# Patient Record
Sex: Male | Born: 1975
Health system: Southern US, Community
[De-identification: ages and names within clinical notes are randomized; demographics above are authoritative.]

## PROBLEM LIST (undated history)

## (undated) DIAGNOSIS — I472 Ventricular tachycardia: Secondary | ICD-10-CM

## (undated) DIAGNOSIS — E785 Hyperlipidemia, unspecified: Secondary | ICD-10-CM

## (undated) DIAGNOSIS — I509 Heart failure, unspecified: Secondary | ICD-10-CM

## (undated) HISTORY — DX: Hyperlipidemia, unspecified: E78.5

## (undated) HISTORY — DX: Ventricular tachycardia: I47.2

---

## 2004-06-24 ENCOUNTER — Emergency Department (HOSPITAL_COMMUNITY): Admission: EM | Admit: 2004-06-24 | Discharge: 2004-06-24 | Payer: Self-pay | Admitting: Emergency Medicine

## 2008-05-31 ENCOUNTER — Emergency Department (HOSPITAL_COMMUNITY): Admission: EM | Admit: 2008-05-31 | Discharge: 2008-05-31 | Payer: Self-pay | Admitting: Family Medicine

## 2008-08-04 ENCOUNTER — Emergency Department (HOSPITAL_COMMUNITY): Admission: EM | Admit: 2008-08-04 | Discharge: 2008-08-04 | Payer: Self-pay | Admitting: Family Medicine

## 2009-06-15 ENCOUNTER — Emergency Department (HOSPITAL_COMMUNITY): Admission: EM | Admit: 2009-06-15 | Discharge: 2009-06-15 | Payer: Self-pay | Admitting: Emergency Medicine

## 2012-06-12 ENCOUNTER — Other Ambulatory Visit (HOSPITAL_COMMUNITY): Payer: Self-pay | Admitting: Family Medicine

## 2012-06-12 DIAGNOSIS — R0602 Shortness of breath: Secondary | ICD-10-CM

## 2012-06-14 DIAGNOSIS — I509 Heart failure, unspecified: Secondary | ICD-10-CM

## 2012-06-14 HISTORY — DX: Heart failure, unspecified: I50.9

## 2012-06-19 ENCOUNTER — Encounter (HOSPITAL_COMMUNITY): Payer: Self-pay

## 2012-07-06 ENCOUNTER — Encounter (HOSPITAL_COMMUNITY): Payer: Self-pay | Admitting: Emergency Medicine

## 2012-07-06 ENCOUNTER — Emergency Department (HOSPITAL_COMMUNITY): Payer: PRIVATE HEALTH INSURANCE

## 2012-07-06 ENCOUNTER — Inpatient Hospital Stay (HOSPITAL_COMMUNITY)
Admission: EM | Admit: 2012-07-06 | Discharge: 2012-07-10 | DRG: 287 | Disposition: A | Discharge status: Home / Self Care | Payer: PRIVATE HEALTH INSURANCE | Attending: Internal Medicine | Admitting: Internal Medicine

## 2012-07-06 DIAGNOSIS — N289 Disorder of kidney and ureter, unspecified: Secondary | ICD-10-CM | POA: Diagnosis present

## 2012-07-06 DIAGNOSIS — I428 Other cardiomyopathies: Secondary | ICD-10-CM | POA: Diagnosis present

## 2012-07-06 DIAGNOSIS — Z23 Encounter for immunization: Secondary | ICD-10-CM

## 2012-07-06 DIAGNOSIS — I4729 Other ventricular tachycardia: Secondary | ICD-10-CM | POA: Diagnosis not present

## 2012-07-06 DIAGNOSIS — R778 Other specified abnormalities of plasma proteins: Secondary | ICD-10-CM | POA: Diagnosis present

## 2012-07-06 DIAGNOSIS — I5023 Acute on chronic systolic (congestive) heart failure: Secondary | ICD-10-CM

## 2012-07-06 DIAGNOSIS — I5041 Acute combined systolic (congestive) and diastolic (congestive) heart failure: Secondary | ICD-10-CM

## 2012-07-06 DIAGNOSIS — Z79899 Other long term (current) drug therapy: Secondary | ICD-10-CM

## 2012-07-06 DIAGNOSIS — I472 Ventricular tachycardia, unspecified: Secondary | ICD-10-CM | POA: Diagnosis not present

## 2012-07-06 DIAGNOSIS — R0602 Shortness of breath: Secondary | ICD-10-CM

## 2012-07-06 DIAGNOSIS — R0601 Orthopnea: Secondary | ICD-10-CM | POA: Diagnosis present

## 2012-07-06 DIAGNOSIS — I10 Essential (primary) hypertension: Secondary | ICD-10-CM | POA: Diagnosis present

## 2012-07-06 DIAGNOSIS — Z8249 Family history of ischemic heart disease and other diseases of the circulatory system: Secondary | ICD-10-CM

## 2012-07-06 DIAGNOSIS — I5021 Acute systolic (congestive) heart failure: Principal | ICD-10-CM | POA: Diagnosis present

## 2012-07-06 DIAGNOSIS — I509 Heart failure, unspecified: Secondary | ICD-10-CM | POA: Diagnosis present

## 2012-07-06 DIAGNOSIS — N179 Acute kidney failure, unspecified: Secondary | ICD-10-CM | POA: Diagnosis present

## 2012-07-06 DIAGNOSIS — R9431 Abnormal electrocardiogram [ECG] [EKG]: Secondary | ICD-10-CM | POA: Diagnosis present

## 2012-07-06 LAB — CBC
HCT: 41.8 % (ref 39.0–52.0)
Hemoglobin: 14.5 g/dL (ref 13.0–17.0)
MCH: 29.2 pg (ref 26.0–34.0)
MCHC: 34.7 g/dL (ref 30.0–36.0)
MCV: 84.3 fL (ref 78.0–100.0)
Platelets: 155 10*3/uL (ref 150–400)
RBC: 4.96 MIL/uL (ref 4.22–5.81)
RDW: 13.3 % (ref 11.5–15.5)
WBC: 6.9 10*3/uL (ref 4.0–10.5)

## 2012-07-06 LAB — CREATININE, SERUM
Creatinine, Ser: 1.45 mg/dL — ABNORMAL HIGH (ref 0.50–1.35)
GFR calc Af Amer: 70 mL/min — ABNORMAL LOW (ref >90)
GFR calc non Af Amer: 61 mL/min — ABNORMAL LOW (ref >90)

## 2012-07-06 LAB — BASIC METABOLIC PANEL
CO2: 27 mEq/L (ref 19–32)
Calcium: 9.4 mg/dL (ref 8.4–10.5)
Chloride: 105 mEq/L (ref 96–112)
Creatinine, Ser: 1.51 mg/dL — ABNORMAL HIGH (ref 0.50–1.35)
Potassium: 4.9 mEq/L (ref 3.5–5.1)
Sodium: 142 mEq/L (ref 135–145)

## 2012-07-06 LAB — URINALYSIS, ROUTINE W REFLEX MICROSCOPIC
Bilirubin Urine: NEGATIVE
Glucose, UA: NEGATIVE mg/dL
Ketones, ur: NEGATIVE mg/dL
Leukocytes, UA: NEGATIVE
Nitrite: NEGATIVE
Specific Gravity, Urine: 1.01 (ref 1.005–1.030)
pH: 6.5 (ref 5.0–8.0)

## 2012-07-06 LAB — CBC WITH DIFFERENTIAL/PLATELET
Basophils Absolute: 0 10*3/uL (ref 0.0–0.1)
HCT: 42.2 % (ref 39.0–52.0)
Hemoglobin: 14.5 g/dL (ref 13.0–17.0)
Lymphocytes Relative: 24 % (ref 12–46)
Lymphs Abs: 1.4 10*3/uL (ref 0.7–4.0)
MCH: 29.2 pg (ref 26.0–34.0)
Neutrophils Relative %: 64 % (ref 43–77)
Platelets: 162 10*3/uL (ref 150–400)
RDW: 13.4 % (ref 11.5–15.5)

## 2012-07-06 LAB — HEPATIC FUNCTION PANEL
AST: 36 U/L (ref 0–37)
Bilirubin, Direct: 0.2 mg/dL (ref 0.0–0.3)
Indirect Bilirubin: 0.9 mg/dL (ref 0.3–0.9)

## 2012-07-06 LAB — RAPID URINE DRUG SCREEN, HOSP PERFORMED
Amphetamines: NOT DETECTED
Benzodiazepines: NOT DETECTED
Cocaine: NOT DETECTED

## 2012-07-06 LAB — TROPONIN I
Troponin I: 0.39 ng/mL (ref <0.30)
Troponin I: 0.42 ng/mL (ref <0.30)

## 2012-07-06 LAB — CK TOTAL AND CKMB (NOT AT ARMC)
CK, MB: 4.2 ng/mL — ABNORMAL HIGH (ref 0.3–4.0)
Relative Index: 3.4 — ABNORMAL HIGH (ref 0.0–2.5)
Total CK: 124 U/L (ref 7–232)

## 2012-07-06 LAB — TSH: TSH: 1.467 u[IU]/mL (ref 0.350–4.500)

## 2012-07-06 LAB — PRO B NATRIURETIC PEPTIDE: Pro B Natriuretic peptide (BNP): 3526 pg/mL — ABNORMAL HIGH (ref 0–125)

## 2012-07-06 MED ORDER — ENOXAPARIN SODIUM 40 MG/0.4ML ~~LOC~~ SOLN
40.0000 mg | Freq: Every day | SUBCUTANEOUS | Status: DC
  Administered 2012-07-06 – 2012-07-07 (×2): 40 mg via SUBCUTANEOUS
  Filled 2012-07-06 (×3): qty 0.4

## 2012-07-06 MED ORDER — ONDANSETRON HCL 4 MG/2ML IJ SOLN
4.0000 mg | Freq: Once | INTRAMUSCULAR | Status: AC
  Administered 2012-07-06: 4 mg via INTRAVENOUS
  Filled 2012-07-06: qty 2

## 2012-07-06 MED ORDER — SODIUM CHLORIDE 0.9 % IV BOLUS (SEPSIS)
250.0000 mL | Freq: Once | INTRAVENOUS | Status: AC
  Administered 2012-07-06: 250 mL via INTRAVENOUS

## 2012-07-06 MED ORDER — PANTOPRAZOLE SODIUM 20 MG PO TBEC
20.0000 mg | DELAYED_RELEASE_TABLET | Freq: Every day | ORAL | Status: DC
Start: 2012-07-06 — End: 2012-07-10
  Administered 2012-07-06 – 2012-07-09 (×4): 20 mg via ORAL
  Filled 2012-07-06 (×5): qty 1

## 2012-07-06 MED ORDER — ONDANSETRON HCL 4 MG/2ML IJ SOLN
4.0000 mg | Freq: Four times a day (QID) | INTRAMUSCULAR | Status: DC | PRN
  Administered 2012-07-07: 4 mg via INTRAVENOUS
  Filled 2012-07-06: qty 2

## 2012-07-06 MED ORDER — PNEUMOCOCCAL VAC POLYVALENT 25 MCG/0.5ML IJ INJ
0.5000 mL | INJECTION | INTRAMUSCULAR | Status: AC
  Filled 2012-07-06: qty 0.5

## 2012-07-06 MED ORDER — IOHEXOL 350 MG/ML SOLN
80.0000 mL | Freq: Once | INTRAVENOUS | Status: AC | PRN
  Administered 2012-07-06: 80 mL via INTRAVENOUS

## 2012-07-06 MED ORDER — ACETAMINOPHEN 325 MG PO TABS
650.0000 mg | ORAL_TABLET | ORAL | Status: DC | PRN
  Administered 2012-07-06 – 2012-07-08 (×2): 650 mg via ORAL
  Filled 2012-07-06 (×3): qty 2

## 2012-07-06 MED ORDER — MONTELUKAST SODIUM 4 MG PO CHEW
4.0000 mg | CHEWABLE_TABLET | Freq: Every day | ORAL | Status: DC
  Administered 2012-07-06 – 2012-07-09 (×4): 4 mg via ORAL
  Filled 2012-07-06 (×5): qty 1

## 2012-07-06 MED ORDER — BUDESONIDE-FORMOTEROL FUMARATE 160-4.5 MCG/ACT IN AERO
2.0000 | INHALATION_SPRAY | Freq: Two times a day (BID) | RESPIRATORY_TRACT | Status: DC
  Administered 2012-07-06 – 2012-07-09 (×5): 2 via RESPIRATORY_TRACT
  Filled 2012-07-06: qty 6

## 2012-07-06 MED ORDER — ALPRAZOLAM 0.25 MG PO TABS
0.2500 mg | ORAL_TABLET | Freq: Three times a day (TID) | ORAL | Status: DC | PRN
  Administered 2012-07-09: 0.25 mg via ORAL
  Filled 2012-07-06: qty 1

## 2012-07-06 MED ORDER — ASPIRIN EC 81 MG PO TBEC
81.0000 mg | DELAYED_RELEASE_TABLET | Freq: Every day | ORAL | Status: DC
  Administered 2012-07-07 – 2012-07-10 (×3): 81 mg via ORAL
  Filled 2012-07-06 (×5): qty 1

## 2012-07-06 MED ORDER — TRAMADOL HCL 50 MG PO TABS
50.0000 mg | ORAL_TABLET | Freq: Four times a day (QID) | ORAL | Status: DC | PRN
  Administered 2012-07-06 – 2012-07-09 (×6): 50 mg via ORAL
  Filled 2012-07-06 (×7): qty 1

## 2012-07-06 MED ORDER — NITROGLYCERIN 0.4 MG SL SUBL: 0.4000 mg | SUBLINGUAL_TABLET | SUBLINGUAL | Status: DC | PRN

## 2012-07-06 MED ORDER — SIMETHICONE 80 MG PO CHEW
80.0000 mg | CHEWABLE_TABLET | Freq: Four times a day (QID) | ORAL | Status: DC | PRN
  Administered 2012-07-07: 80 mg via ORAL
  Filled 2012-07-06 (×3): qty 1

## 2012-07-06 MED ORDER — SODIUM CHLORIDE 0.9 % IV SOLN
INTRAVENOUS | Status: DC
  Administered 2012-07-06 – 2012-07-07 (×3): via INTRAVENOUS

## 2012-07-06 MED ORDER — FUROSEMIDE 10 MG/ML IJ SOLN
40.0000 mg | Freq: Once | INTRAMUSCULAR | Status: AC
  Administered 2012-07-06: 40 mg via INTRAVENOUS
  Filled 2012-07-06: qty 4

## 2012-07-06 MED ORDER — CARVEDILOL 3.125 MG PO TABS
3.1250 mg | ORAL_TABLET | Freq: Two times a day (BID) | ORAL | Status: DC
  Administered 2012-07-06 – 2012-07-10 (×8): 3.125 mg via ORAL
  Filled 2012-07-06 (×11): qty 1

## 2012-07-06 MED ORDER — ZOLPIDEM TARTRATE 5 MG PO TABS
5.0000 mg | ORAL_TABLET | Freq: Every evening | ORAL | Status: DC | PRN
  Administered 2012-07-06 – 2012-07-09 (×3): 5 mg via ORAL
  Filled 2012-07-06 (×3): qty 1

## 2012-07-06 MED ORDER — FUROSEMIDE 10 MG/ML IJ SOLN
40.0000 mg | Freq: Once | INTRAMUSCULAR | Status: AC
  Administered 2012-07-06: 40 mg via INTRAVENOUS
  Filled 2012-07-06 (×2): qty 4

## 2012-07-06 MED ORDER — ALBUTEROL SULFATE HFA 108 (90 BASE) MCG/ACT IN AERS: 2.0000 | INHALATION_SPRAY | Freq: Four times a day (QID) | RESPIRATORY_TRACT | Status: DC | PRN

## 2012-07-06 MED ORDER — ALPRAZOLAM 0.25 MG PO TABS: 0.2500 mg | ORAL_TABLET | Freq: Two times a day (BID) | ORAL | Status: DC | PRN

## 2012-07-06 NOTE — Progress Notes (Signed)
Corine Shelter notified in reference to pt having 6 beats of Vtach on the monitor and heartrate sustaining in the 120s, pt asymptomatic at this time, no new orders given, will continue to monitor

## 2012-07-06 NOTE — ED Provider Notes (Addendum)
History     CSN: [id number]  Arrival date & time 07/06/12  0713   First MD Initiated Contact with Patient 07/06/12 [phone]      Chief Complaint  Patient presents with  . Shortness of Breath    (Consider location/radiation/quality/duration/timing/severity/associated sxs/prior treatment) Patient is a 37 y.o. male presenting with shortness of breath. The history is provided by the patient.  Shortness of Breath Associated symptoms: chest pain, headaches, vomiting and wheezing   Associated symptoms: no abdominal pain, no cough, no fever and no rash   patient presents with a complaint of shortness of breath for the past 2 months. [facility] is [facility]. They have done a workup to include chest x-ray thyroid function testing. And have recently referred him to pulmonary medicine for permanent function tests. Patient has not had that done yet. Patient also had a 20 pound weight loss. Patient has had shortness of breath both exertional and with laying flat it has been getting worse last night but typically worse. It is associated with brief chest pain lasting one to 2 seconds intermittently. Occasional vomiting no fevers no bowel pain. No leg swelling. To date his studies have been negative. No history of hypertension. He reports that his blood pressure was high here today 142/112. Nurses: At room air saturation was 98%.  In addition family medicine has been treating him with albuterol inhaler. Patient feels that his throat is closing off at times particularly at night. He now has to sleep sitting up. Does not sound like there's been an evaluation to rule out pulmonary embolism.  History reviewed. No pertinent past medical history.  History reviewed. No pertinent past surgical history.  History reviewed. No pertinent family history.  History  Substance Use Topics  . Smoking status: Never Smoker   . Smokeless tobacco: Not on file  . Alcohol Use: No      Review of Systems   Constitutional: Positive for fatigue. Negative for fever.  HENT: Positive for congestion.   Eyes: Negative for visual disturbance.  Respiratory: Positive for shortness of breath and wheezing. Negative for cough and choking.   Cardiovascular: Positive for chest pain. Negative for palpitations and leg swelling.  Gastrointestinal: Positive for nausea and vomiting. Negative for abdominal pain and diarrhea.  Genitourinary: Negative for dysuria and hematuria.  Musculoskeletal: Negative for back pain.  Skin: Negative for rash.  Neurological: Positive for headaches.  Hematological: Negative for adenopathy. Does not bruise/bleed easily.  Psychiatric/Behavioral: Negative for confusion.    Allergies  Review of patient's allergies indicates no known allergies.  Home Medications   Current Outpatient Rx  Name  Route  Sig  Dispense  Refill  . albuterol (PROVENTIL HFA;VENTOLIN HFA) 108 (90 BASE) MCG/ACT inhaler   Inhalation   Inhale 2 puffs into the lungs every 6 (six) hours as needed for wheezing.         . montelukast (SINGULAIR) 4 MG chewable tablet   Oral   Chew 4 mg by mouth at bedtime.         . pantoprazole (PROTONIX) 20 MG tablet   Oral   Take 20 mg by mouth daily.         . Simethicone (GAS-X PO)   Oral   Take 1 tablet by mouth every 4 (four) hours as needed (upset stomach and bloating).           BP 128/80  Pulse 94  Temp(Src) 97.3 F (36.3 C) (Oral)  Resp 22  SpO2 97%  Physical Exam  Nursing note and vitals reviewed. Constitutional: He is oriented to person, place, and time. He appears well-developed and well-nourished. No distress.  HENT:  Head: Normocephalic and atraumatic.  Mouth/Throat: Oropharynx is clear and moist. No oropharyngeal exudate.  Eyes: Conjunctivae and EOM are normal. Pupils are equal, round, and reactive to light.  Neck: Normal range of motion. Neck supple. No JVD present. No tracheal deviation present. No thyromegaly present.   Cardiovascular: Normal rate, regular rhythm, normal heart sounds and intact distal pulses.   No murmur heard. Pulmonary/Chest: Effort normal. No stridor. No respiratory distress. He has no wheezes. He has no rales.  Abdominal: Soft. Bowel sounds are normal. There is no tenderness.  Musculoskeletal: Normal range of motion. He exhibits no edema.  Lymphadenopathy:    He has no cervical adenopathy.  Neurological: He is alert and oriented to person, place, and time. No cranial nerve deficit. He exhibits normal muscle tone. Coordination normal.  Skin: Skin is warm. No rash noted.    ED Course  Procedures (including critical care time)  Labs Reviewed  TROPONIN I - Abnormal; Notable for the following:    Troponin I 0.43 (*)    All other components within normal limits  PRO B NATRIURETIC PEPTIDE - Abnormal; Notable for the following:    Pro B Natriuretic peptide (BNP) 3526.0 (*)    All other components within normal limits  BASIC METABOLIC PANEL - Abnormal; Notable for the following:    Creatinine, Ser 1.51 (*)    GFR calc non Af Amer 58 (*)    GFR calc Af Amer 67 (*)    All other components within normal limits  HEPATIC FUNCTION PANEL - Abnormal; Notable for the following:    ALT 59 (*)    All other components within normal limits  TROPONIN I - Abnormal; Notable for the following:    Troponin I 0.39 (*)    All other components within normal limits  CBC WITH DIFFERENTIAL  LIPASE, BLOOD  URINALYSIS, ROUTINE W REFLEX MICROSCOPIC  URINE RAPID DRUG SCREEN (HOSP PERFORMED)   Dg Chest 2 View  07/06/2012  *RADIOLOGY REPORT*  Clinical Data: Shortness of breath  CHEST - 2 VIEW  Comparison: 04/22/2012  Findings: Moderate cardiomegaly.  Some increase in interstitial edema or infiltrates.  Septal lines seen peripherally at the right lung base.  No effusion.  There is some mild central peribronchial thickening.  Regional bones unremarkable.  IMPRESSION:  Cardiomegaly with interval increase in  interstitial edema.   Original Report Authenticated By: D. Andria Rhein, MD    Ct Soft Tissue Neck W Contrast  07/06/2012  *RADIOLOGY REPORT*  Clinical Data: Short of breath, hoarseness, gurgling in throat  CT NECK WITH CONTRAST  Technique:  Multidetector CT imaging of the neck was performed with intravenous contrast.  Contrast: 80mL OMNIPAQUE IOHEXOL 350 MG/ML SOLN .  Comparison: CT thorax same day  Findings: The oropharynx and hypopharynx are normal.  The epiglottis and glottis are normal.  The subglottic tissues are normal.  The parapharyngeal space is normal.  The pharyngeal mucosa is symmetric.  Submandibular glands and parotid glands are normal.  The prevertebral space is normal.  No evidence of lymphadenopathy in the neck.  There is no aggressive osseous lesions.  There is a polypoid mucosal lesion in the right maxillary sinus measuring 3.2 cm.  Limited view of the lung apices demonstrates interlobular septal thickening.  Mild mediastinal lymphadenopathy.  No vascular abnormality in the neck.  IMPRESSION:  1.  No abnormality of the hypopharynx and glottis. 2.  No evidence of lymphadenopathy in neck. 3.  Interlobular septal thickening seen in the lung apices consistent with interstitial edema. 4.  Likely  mucous retention cyst within the right maxillary sinus.   Original Report Authenticated By: Genevive Bi, M.D.    Ct Angio Chest Pe W/cm &/or Wo Cm  07/06/2012  *RADIOLOGY REPORT*  Clinical Data: Shortness of breath, hoarseness.  CT ANGIOGRAPHY CHEST  Technique:  Multidetector CT imaging of the chest using the standard protocol during bolus administration of intravenous contrast. Multiplanar reconstructed images including MIPs were obtained and reviewed to evaluate the vascular anatomy.  Contrast: 80mL OMNIPAQUE IOHEXOL 350 MG/ML SOLN  Comparison: None.  Findings: There is good contrast opacification of the pulmonary artery branches.  No discrete filling defect to suggest acute PE.There is incomplete  opacification of the thoracic aorta, which is normal in caliber.  There is four-chamber cardiac enlargement. Small bilateral pleural effusions.  Reflux of contrast from the right atrium into hepatic veins suggesting a degree of right heart dysfunction.  14 mm prevascular lymph node, 11 mm precarinal lymph node, 11 mm pretracheal lymph node.  No hilar adenopathy.  Patchy ground-glass opacities in basilar segments of both lower lobes, with septal thickening seen medially in the upper and lower lobes. No confluent airspace consolidation.  Thoracic spine and sternum intact.  IMPRESSION:  1.  Negative for acute PE. 2.  Small pleural effusions, septal thickening, contrast reflux into hepatic veins, and cardiomegaly suggesting CHF. 3.  Nonspecific mediastinal adenopathy.   Original Report Authenticated By: D. Andria Rhein, MD      Date: 07/06/2012  Rate: 87  Rhythm: normal sinus rhythm  QRS Axis: normal  Intervals: normal  ST/T Wave abnormalities: nonspecific T wave changes  Conduction Disutrbances:left anterior fascicular block  Narrative Interpretation:   Old EKG Reviewed: none available  Results for orders placed during the hospital encounter of 07/06/12  TROPONIN I      Result Value Range   Troponin I 0.43 (*) <0.30 ng/mL  CBC WITH DIFFERENTIAL      Result Value Range   WBC 5.8  4.0 - 10.5 K/uL   RBC 4.97  4.22 - 5.81 MIL/uL   Hemoglobin 14.5  13.0 - 17.0 g/dL   HCT 16.1  09.6 - 04.5 %   MCV 84.9  78.0 - 100.0 fL   MCH 29.2  26.0 - 34.0 pg   MCHC 34.4  30.0 - 36.0 g/dL   RDW 40.9  81.1 - 91.4 %   Platelets 162  150 - 400 K/uL   Neutrophils Relative 64  43 - 77 %   Neutro Abs 3.7  1.7 - 7.7 K/uL   Lymphocytes Relative 24  12 - 46 %   Lymphs Abs 1.4  0.7 - 4.0 K/uL   Monocytes Relative 10  3 - 12 %   Monocytes Absolute 0.6  0.1 - 1.0 K/uL   Eosinophils Relative 2  0 - 5 %   Eosinophils Absolute 0.1  0.0 - 0.7 K/uL   Basophils Relative 0  0 - 1 %   Basophils Absolute 0.0  0.0 - 0.1 K/uL   LIPASE, BLOOD      Result Value Range   Lipase 20  11 - 59 U/L  PRO B NATRIURETIC PEPTIDE      Result Value Range   Pro B Natriuretic peptide (BNP) 3526.0 (*) 0 - 125 pg/mL  BASIC METABOLIC PANEL  Result Value Range   Sodium 142  135 - 145 mEq/L   Potassium 4.9  3.5 - 5.1 mEq/L   Chloride 105  96 - 112 mEq/L   CO2 27  19 - 32 mEq/L   Glucose, Bld 99  70 - 99 mg/dL   BUN 15  6 - 23 mg/dL   Creatinine, Ser 1.61 (*) 0.50 - 1.35 mg/dL   Calcium 9.4  8.4 - 09.6 mg/dL   GFR calc non Af Amer 58 (*) >90 mL/min   GFR calc Af Amer 67 (*) >90 mL/min  HEPATIC FUNCTION PANEL      Result Value Range   Total Protein 6.4  6.0 - 8.3 g/dL   Albumin 3.6  3.5 - 5.2 g/dL   AST 36  0 - 37 U/L   ALT 59 (*) 0 - 53 U/L   Alkaline Phosphatase 50  39 - 117 U/L   Total Bilirubin 1.1  0.3 - 1.2 mg/dL   Bilirubin, Direct 0.2  0.0 - 0.3 mg/dL   Indirect Bilirubin 0.9  0.3 - 0.9 mg/dL  TROPONIN I      Result Value Range   Troponin I 0.39 (*) <0.30 ng/mL   The patient actually [facility]. Patient thought he was followed by [facility].  Patient's initial troponin was elevated the at 0.43 we'll do a repeat 2 hour Mark. Patient still has no chest pain.  1. CHF (congestive heart failure)   2. Shortness of breath       MDM    The patient's second troponin was also elevated at 0.39. Patient's EKG otherwise nonspecific T wave abnormalities nothing acute. Patient has not had a complain of any chest pain. Discussed with unassigned cardiology Dr. Riley Kill they will see the patient and admit. The patient was given 40 mg of IV Lasix with good diuresis. Patient's blood pressure diastolic have been marginal anywhere from 110-88. Patient with new diagnosis of CHF was not expecting that finding initially. Patient's had prior chest x-rays by his primary care Dr. Claiborne Billings been interpreted as normal. However patient does have evidence of PE. It looks like new onset CHF etiology  may be hypertensive also some questions of may be a viral illness back in January that could be contributing.   Addendum: Spoke with the wrong cardiologist. [facility]  cardiology is not on call for unassigned. Dr Tresa Endo from [facility] will admit for CHF.     Shelda Jakes, MD 07/06/12 1154  Shelda Jakes, MD 07/07/12 [phone]

## 2012-07-06 NOTE — ED Notes (Signed)
Cardiology consult at bedside.

## 2012-07-06 NOTE — ED Notes (Signed)
Lasix held per MD Nicki Guadalajara.

## 2012-07-06 NOTE — ED Notes (Signed)
Attempt to call report.  Unable to take report.  Will call back in 10 minutes.

## 2012-07-06 NOTE — ED Notes (Signed)
Pt reports whenever I lay down I feel like there is "gurling in my mouth."

## 2012-07-06 NOTE — ED Notes (Signed)
MD at bedside. 

## 2012-07-06 NOTE — ED Notes (Signed)
Note placed at 1320 is an error.

## 2012-07-06 NOTE — H&P (Addendum)
Patient ID: Eric Daniel MRN: [id number], DOB/AGE: [birth year]   Admit date: 07/06/2012   Primary Physician: Dr Lupe Carney Primary Cardiologist: Dr Tresa Endo (new)  HPI:    37 y/o otherwise healthy AA male, works at [employer], admitted to the ER with complaints of DOE, orthopnea X 3-4 months. He has been seeing his primary MD. He says he has noted increasing DOE at work using a hand truck. He says he feels more SOB laying flat and has taken to sleeping in a chair. He thinks his symptoms did get somewhat better after a course of steroids but his symptoms have recurred the last few days. This am he noted increasing SOB and came to the ER.  CXR, BNP, CTA all suggest CHF. His Troponin is also positive 0.43-0.39. His EKG is abnormal with poor anterior RW. Interestingly his father has a "viral cardiomyopathy" and just received a VAD. His father is followed by Dr Gala Romney.    Problem List: History reviewed. No pertinent past medical history.  History reviewed. No pertinent past surgical history.   Allergies: No Known Allergies   Home Medications  (Not in a hospital admission)   History reviewed. No pertinent family history.   History   Social History  . Marital Status: Married    Spouse Name: N/A    Number of Children: N/A  . Years of Education: N/A   Occupational History  . Not on file.   Social History Main Topics  . Smoking status: Never Smoker   . Smokeless tobacco: Not on file  . Alcohol Use: No  . Drug Use: No  . Sexually Active: Not on file   Other Topics Concern  . Not on file   Social History Narrative  . No narrative on file     Review of Systems: General: negative for chills, fever, night sweats or weight changes.  Cardiovascular: negative for chest pain, dyspnea on exertion, edema, orthopnea, palpitations, paroxysmal nocturnal dyspnea or shortness of breath Dermatological: negative for rash Respiratory: negative for cough or wheezing Urologic:  negative for hematuria Abdominal: negative for nausea, vomiting, diarrhea, bright red blood per rectum, melena, or hematemesis Neurologic: negative for visual changes, syncope, or dizziness All other systems reviewed and are otherwise negative except as noted above.  Physical Exam: Blood pressure 121/78, pulse 94, temperature 97.3 F (36.3 C), temperature source Oral, resp. rate 18, SpO2 98.00%.  General appearance: alert, cooperative and no distress Neck: no carotid bruit and no JVD Lungs: crackles Lt base Heart: tachycardic at 110 rare ectopic complex Abdomen: soft, non-tender; bowel sounds normal; no masses,  no organomegaly Extremities: no edema Pulses: diminnished radial and pedal pulses Skin: Skin color, texture, turgor normal. No rashes or lesions Neurologic: Grossly normal    Labs:   Results for orders placed during the hospital encounter of 07/06/12 (from the past 24 hour(s))  CBC WITH DIFFERENTIAL     Status: None   Collection Time    07/06/12  7:49 AM      Result Value Range   WBC 5.8  4.0 - 10.5 K/uL   RBC 4.97  4.22 - 5.81 MIL/uL   Hemoglobin 14.5  13.0 - 17.0 g/dL   HCT 08.6  57.8 - 46.9 %   MCV 84.9  78.0 - 100.0 fL   MCH 29.2  26.0 - 34.0 pg   MCHC 34.4  30.0 - 36.0 g/dL   RDW 62.9  52.8 - 41.3 %   Platelets 162  150 - 400 K/uL  Neutrophils Relative 64  43 - 77 %   Neutro Abs 3.7  1.7 - 7.7 K/uL   Lymphocytes Relative 24  12 - 46 %   Lymphs Abs 1.4  0.7 - 4.0 K/uL   Monocytes Relative 10  3 - 12 %   Monocytes Absolute 0.6  0.1 - 1.0 K/uL   Eosinophils Relative 2  0 - 5 %   Eosinophils Absolute 0.1  0.0 - 0.7 K/uL   Basophils Relative 0  0 - 1 %   Basophils Absolute 0.0  0.0 - 0.1 K/uL  LIPASE, BLOOD     Status: None   Collection Time    07/06/12  7:49 AM      Result Value Range   Lipase 20  11 - 59 U/L  BASIC METABOLIC PANEL     Status: Abnormal   Collection Time    07/06/12  7:49 AM      Result Value Range   Sodium 142  135 - 145 mEq/L    Potassium 4.9  3.5 - 5.1 mEq/L   Chloride 105  96 - 112 mEq/L   CO2 27  19 - 32 mEq/L   Glucose, Bld 99  70 - 99 mg/dL   BUN 15  6 - 23 mg/dL   Creatinine, Ser 1.61 (*) 0.50 - 1.35 mg/dL   Calcium 9.4  8.4 - 09.6 mg/dL   GFR calc non Af Amer 58 (*) >90 mL/min   GFR calc Af Amer 67 (*) >90 mL/min  HEPATIC FUNCTION PANEL     Status: Abnormal   Collection Time    07/06/12  7:49 AM      Result Value Range   Total Protein 6.4  6.0 - 8.3 g/dL   Albumin 3.6  3.5 - 5.2 g/dL   AST 36  0 - 37 U/L   ALT 59 (*) 0 - 53 U/L   Alkaline Phosphatase 50  39 - 117 U/L   Total Bilirubin 1.1  0.3 - 1.2 mg/dL   Bilirubin, Direct 0.2  0.0 - 0.3 mg/dL   Indirect Bilirubin 0.9  0.3 - 0.9 mg/dL  TROPONIN I     Status: Abnormal   Collection Time    07/06/12  7:50 AM      Result Value Range   Troponin I 0.43 (*) <0.30 ng/mL  PRO B NATRIURETIC PEPTIDE     Status: Abnormal   Collection Time    07/06/12  7:50 AM      Result Value Range   Pro B Natriuretic peptide (BNP) 3526.0 (*) 0 - 125 pg/mL  TROPONIN I     Status: Abnormal   Collection Time    07/06/12  9:49 AM      Result Value Range   Troponin I 0.39 (*) <0.30 ng/mL  URINALYSIS, ROUTINE W REFLEX MICROSCOPIC     Status: None   Collection Time    07/06/12 11:49 AM      Result Value Range   Color, Urine YELLOW  YELLOW   APPearance CLEAR  CLEAR   Specific Gravity, Urine 1.010  1.005 - 1.030   pH 6.5  5.0 - 8.0   Glucose, UA NEGATIVE  NEGATIVE mg/dL   Hgb urine dipstick NEGATIVE  NEGATIVE   Bilirubin Urine NEGATIVE  NEGATIVE   Ketones, ur NEGATIVE  NEGATIVE mg/dL   Protein, ur NEGATIVE  NEGATIVE mg/dL   Urobilinogen, UA 0.2  0.0 - 1.0 mg/dL   Nitrite NEGATIVE  NEGATIVE   Leukocytes,  UA NEGATIVE  NEGATIVE     Radiology/Studies: Dg Chest 2 View  07/06/2012  *RADIOLOGY REPORT*  Clinical Data: Shortness of breath  CHEST - 2 VIEW  Comparison: 04/22/2012  Findings: Moderate cardiomegaly.  Some increase in interstitial edema or infiltrates.  Septal  lines seen peripherally at the right lung base.  No effusion.  There is some mild central peribronchial thickening.  Regional bones unremarkable.  IMPRESSION:  Cardiomegaly with interval increase in interstitial edema.   Original Report Authenticated By: D. Andria Rhein, MD    Ct Soft Tissue Neck W Contrast  07/06/2012  *RADIOLOGY REPORT*  Clinical Data: Short of breath, hoarseness, gurgling in throat  CT NECK WITH CONTRAST  Technique:  Multidetector CT imaging of the neck was performed with intravenous contrast.  Contrast: 80mL OMNIPAQUE IOHEXOL 350 MG/ML SOLN .  Comparison: CT thorax same day  Findings: The oropharynx and hypopharynx are normal.  The epiglottis and glottis are normal.  The subglottic tissues are normal.  The parapharyngeal space is normal.  The pharyngeal mucosa is symmetric.  Submandibular glands and parotid glands are normal.  The prevertebral space is normal.  No evidence of lymphadenopathy in the neck.  There is no aggressive osseous lesions.  There is a polypoid mucosal lesion in the right maxillary sinus measuring 3.2 cm.  Limited view of the lung apices demonstrates interlobular septal thickening.  Mild mediastinal lymphadenopathy.  No vascular abnormality in the neck.  IMPRESSION:  1.  No abnormality of the hypopharynx and glottis. 2.  No evidence of lymphadenopathy in neck. 3.  Interlobular septal thickening seen in the lung apices consistent with interstitial edema. 4.  Likely  mucous retention cyst within the right maxillary sinus.   Original Report Authenticated By: Genevive Bi, M.D.    Ct Angio Chest Pe W/cm &/or Wo Cm  07/06/2012  *RADIOLOGY REPORT*  Clinical Data: Shortness of breath, hoarseness.  CT ANGIOGRAPHY CHEST  Technique:  Multidetector CT imaging of the chest using the standard protocol during bolus administration of intravenous contrast. Multiplanar reconstructed images including MIPs were obtained and reviewed to evaluate the vascular anatomy.  Contrast: 80mL  OMNIPAQUE IOHEXOL 350 MG/ML SOLN  Comparison: None.  Findings: There is good contrast opacification of the pulmonary artery branches.  No discrete filling defect to suggest acute PE.There is incomplete opacification of the thoracic aorta, which is normal in caliber.  There is four-chamber cardiac enlargement. Small bilateral pleural effusions.  Reflux of contrast from the right atrium into hepatic veins suggesting a degree of right heart dysfunction.  14 mm prevascular lymph node, 11 mm precarinal lymph node, 11 mm pretracheal lymph node.  No hilar adenopathy.  Patchy ground-glass opacities in basilar segments of both lower lobes, with septal thickening seen medially in the upper and lower lobes. No confluent airspace consolidation.  Thoracic spine and sternum intact.  IMPRESSION:  1.  Negative for acute PE. 2.  Small pleural effusions, septal thickening, contrast reflux into hepatic veins, and cardiomegaly suggesting CHF. 3.  Nonspecific mediastinal adenopathy.   Original Report Authenticated By: D. Andria Rhein, MD     EKG:NSR, LAFB, poor anterior RW  ASSESSMENT AND PLAN:  Principal Problem:   Acute CHF- etiol not yet determined-  Active Problems:   Dyspnea on exertion X 2mos   Orthopnea   Elevated troponin- ? secondary to CHF vs NSTEMI (0.43)   Abnormal EKG- LAFB, poor anterior RW   Renal insufficiency- SCr 1.5    Family history of cardiomyopathy- his father just recieved  a VAD  Plan- Admit, echo, diureses, add low dose beta blocker, hold on ACE as SCr is 1.5 and he just had a CTA with contrast. Consider RT and Lt cath in am. Check CK/MB  Signed, Abelino Derrick, PA-C 07/06/2012, 12:53 PM   Patient seen and examined. Agree with assessment and plan. Very pleasant 37 yo AA who admits to having a "cold" in January. Subsequently he has noticed progressive sob which has resulted in decline in exercise tolerance, exertional dyspnea and recent PND and orthopnea. Pex now consistent with CHF. BNP  elevated at 3526, CXR suggests interstitial edema. He has diureses ~ 2.3 liters so far after a 40 mg Lasix dose. ECG reveals sinus rhythm with PRWP, LAHB and nonspecific lateral T wave changes. Will admit. Check 2d-echo. Start carvedilol at 3.125 mg bid. Cr 1.5 today, but he has received contrast for CT and must follow to make certain no significant worsening of renal function. Will not start ACE-I at this time for that reason. Of note, father had viral cardiomyopathy and underwent VAD insertion recently.    Lennette Bihari, MD, [facility] 07/06/2012 1:22 PM

## 2012-07-06 NOTE — ED Notes (Signed)
Critical lab value- troponin 0.43- MD Zackowski made aware. Reported by Edythe Clarity in lab.

## 2012-07-06 NOTE — ED Notes (Signed)
Pt reports to losing peripheral vision to right eye for 1-1.5 minutes.  Vision is normal at this time.  MD Zackowski made aware.

## 2012-07-07 DIAGNOSIS — I5021 Acute systolic (congestive) heart failure: Principal | ICD-10-CM

## 2012-07-07 DIAGNOSIS — I059 Rheumatic mitral valve disease, unspecified: Secondary | ICD-10-CM

## 2012-07-07 DIAGNOSIS — I5041 Acute combined systolic (congestive) and diastolic (congestive) heart failure: Secondary | ICD-10-CM

## 2012-07-07 LAB — BASIC METABOLIC PANEL
BUN: 15 mg/dL (ref 6–23)
CO2: 30 mEq/L (ref 19–32)
Calcium: 9.2 mg/dL (ref 8.4–10.5)
Chloride: 104 mEq/L (ref 96–112)
Creatinine, Ser: 1.49 mg/dL — ABNORMAL HIGH (ref 0.50–1.35)
GFR calc Af Amer: 68 mL/min — ABNORMAL LOW (ref >90)
GFR calc non Af Amer: 59 mL/min — ABNORMAL LOW (ref >90)
Glucose, Bld: 94 mg/dL (ref 70–99)
Potassium: 4.8 mEq/L (ref 3.5–5.1)
Sodium: 141 mEq/L (ref 135–145)

## 2012-07-07 LAB — MAGNESIUM: Magnesium: 2 mg/dL (ref 1.5–2.5)

## 2012-07-07 MED ORDER — ISOSORBIDE MONONITRATE 15 MG HALF TABLET
15.0000 mg | ORAL_TABLET | Freq: Every day | ORAL | Status: DC
  Administered 2012-07-07 – 2012-07-09 (×3): 15 mg via ORAL
  Filled 2012-07-07 (×3): qty 1

## 2012-07-07 MED ORDER — SODIUM CHLORIDE 0.9 % IV SOLN: INTRAVENOUS | Status: DC

## 2012-07-07 MED ORDER — DIGOXIN 125 MCG PO TABS
0.1250 mg | ORAL_TABLET | Freq: Every day | ORAL | Status: DC
  Administered 2012-07-07 – 2012-07-10 (×4): 0.125 mg via ORAL
  Filled 2012-07-07 (×4): qty 1

## 2012-07-07 MED ORDER — SODIUM CHLORIDE 0.9 % IJ SOLN
3.0000 mL | Freq: Two times a day (BID) | INTRAMUSCULAR | Status: DC
  Administered 2012-07-07 – 2012-07-08 (×2): 3 mL via INTRAVENOUS

## 2012-07-07 MED ORDER — HYDRALAZINE HCL 25 MG PO TABS
12.5000 mg | ORAL_TABLET | Freq: Three times a day (TID) | ORAL | Status: DC
  Administered 2012-07-07 – 2012-07-08 (×4): 12.5 mg via ORAL
  Filled 2012-07-07 (×7): qty 0.5

## 2012-07-07 MED ORDER — ASPIRIN 81 MG PO CHEW
324.0000 mg | CHEWABLE_TABLET | ORAL | Status: AC
  Administered 2012-07-08: 324 mg via ORAL
  Filled 2012-07-07: qty 4

## 2012-07-07 MED ORDER — SODIUM CHLORIDE 0.9 % IJ SOLN: 3.0000 mL | INTRAMUSCULAR | Status: DC | PRN

## 2012-07-07 MED ORDER — SODIUM CHLORIDE 0.9 % IV SOLN: 250.0000 mL | INTRAVENOUS | Status: DC | PRN

## 2012-07-07 NOTE — Plan of Care (Addendum)
Problem: Food- and Nutrition-Related Knowledge Deficit (NB-1.1) Goal: Nutrition education Formal process to instruct or train a patient/client in a skill or to impart knowledge to help patients/clients voluntarily manage or modify food choices and eating behavior to maintain or improve health. Outcome: Completed/Met Date Met:  07/07/12 Spoke with pt about a low-sodium diet. Teach-back method was utilized and provided pt with Academy of Nutrition and Dietetics handout: Lo-Sodium Nutrition Therapy.  Pt was very interested and appeared eager to learn and asked appropriate questions about good, low-sodium food choices. Expect good compliance.  No further nutrition needs at this time. Please consult RD if further nutrition needs arise.   Trenton Gammon Dietetic Intern # [phone]         Clarene Duke RD, LDN Pager [phone] After Hours pager [phone]

## 2012-07-07 NOTE — Progress Notes (Signed)
Subjective:  Feels much better this AM from a breathing standpoint.   Very anxious.  Has asked for Dr. Gala Romney to assume care based upon relationship developed via his father.  Objective:  Vital Signs in the last 24 hours: Temp:  [97.3 F (36.3 C)-98 F (36.7 C)] 97.5 F (36.4 C) (03/24 0500) Pulse Rate:  [60-108] 87 (03/24 0500) Resp:  [16-27] 16 (03/24 0500) BP: (118-136)/(71-100) 128/91 mmHg (03/24 0500) SpO2:  [93 %-100 %] 97 % (03/24 0500) Weight:  [100.1 kg (220 lb 10.9 oz)-102.74 kg (226 lb 8 oz)] 100.1 kg (220 lb 10.9 oz) (03/24 0500)  Intake/Output from previous day: 03/23 0701 - 03/24 0700 In: 1320 [P.O.:1320] Out: 6675 [Urine:6675] Intake/Output from this shift:    Physical Exam: General appearance: alert, cooperative and no distress Neck: JVD - a few cm above sternal notch, no adenopathy and no carotid bruit Lungs: clear to auscultation bilaterally, normal percussion bilaterally and non-labored Heart: tachycardic, with nl S1/S2 & possible S4; PMI laterally discplaced and sustained, no M/R Abdomen: soft, NT/ND - mild Hepatomegally with HJR Extremities: edema trace Pulses: 2+ and symmetric Neurologic: Grossly normal; anxious  Lab Results:  Recent Labs  07/06/12 0749 07/06/12 1530  WBC 5.8 6.9  HGB 14.5 14.5  PLT 162 155    Recent Labs  07/06/12 0749 07/06/12 1530 07/07/12 0615  NA 142  --  141  K 4.9  --  4.8  CL 105  --  104  CO2 27  --  30  GLUCOSE 99  --  94  BUN 15  --  15  CREATININE 1.51* 1.45* 1.49*    Recent Labs  07/06/12 0949 07/06/12 1404  TROPONINI 0.39* 0.42*   Hepatic Function Panel  Recent Labs  07/06/12 0749  PROT 6.4  ALBUMIN 3.6  AST 36  ALT 59*  ALKPHOS 50  BILITOT 1.1  BILIDIR 0.2  IBILI 0.9   Imaging: Imaging reviewed. No new studies this AM  Cardiac Studies: Echo 3/24:  Severely dilated LV with diffuse hypokinesis & EF ~15%.  Mild MR  Assessment/Plan:  Principal Problem:   Acute CHF- etiol not yet  determined, indolent onset; EF 15% by Echo Active Problems:   Family history of cardiomyopathy- his father just recieved a VAD   Elevated troponin- ? secondary to CHF/cardiomyopathy vs NSTEMI (0.43)   Renal insufficiency- SCr 1.5    Dyspnea on exertion X 2mos   Orthopnea   Abnormal EKG- LAFB, poor anterior RW  New Dx of Severe Cardiomyopathy in an otherwise / previously healthy young man who happens to have a father with H/o presumed Viral CM & is s/p VAD - followed by Dr. Gala Romney.  He appears to be clinically improved after IV Lasix diuresis.  Echo this AM confirmed concern for CHF -- clinically, with indolent course over several months, would suspect a non-ischemic etiology.  He has asked for Dr. Gala Romney to assume care as Advanced CHF team -- they have been notified & will assume care.  Remains tachycardic with a short burst of NSVT -- BB started yesterday, but ACE-I not started due to questionable renal function.  I suspect that tachycardia is compensatory & would be reluctant to up-titrate BB, may consider hydralazine / nitrate for afterload reduction until ACE-I/ARB can be started (likely post cath). There was confusion re possible cath today -- he was made NPO & IVF started.  I think this may be a bit premature with Cr in 1.5 range & recent IV Contrast CT  scan yesterday.  I have d/c'd IVF & written for diet, but will defer decision re timing of cath to Dr. Gala Romney.  No additional Lasix was written for, may wait for R&LHC results to determine fluid status to adjust dosing -- had significant UOP after just one IV dose, may simply be able to use PO.   LOS: 1 day    Eric Daniel,Eric Daniel 07/07/2012, 10:52 AM

## 2012-07-07 NOTE — Care Management Note (Signed)
    Page 1 of 1   07/07/2012     3:41:03 PM   CARE MANAGEMENT NOTE 07/07/2012  Patient:  Eric Daniel, Eric Daniel   Account Number:  [id number]  Date Initiated:  07/07/2012  Documentation initiated by:  [facility]  Subjective/Objective Assessment:   37yo male  complaints of DOE, orthopnea X 3-4 months;CXR, BNP, CTA suggest CHF. His Troponin is also positive 0.43-0.39. His EKG is abnormal with poor anterior RW. Interestingly his father has a "viral cardiomyopathy" and just received a VAD     Action/Plan:   Home with spouse/ [facility]   Anticipated DC Date:  07/10/2012   Anticipated DC Plan:  HOME/SELF CARE      DC Planning Services  CM consult      Choice offered to / List presented to:             Status of service:  In process, will continue to follow Medicare Important Message given?   (If response is "NO", the following Medicare IM given date fields will be blank) Date Medicare IM given:   Date Additional Medicare IM given:    Discharge Disposition:    Per UR Regulation:    If discussed at Long Length of Stay Meetings, dates discussed:    Comments:  07/07/12.Marland KitchenMarland Kitchen[address] RN, [place] Major [phone] Spoke with Morrie Sheldon of [facility] concerning pt needing [facility].  Morrie Sheldon will begin paperwork and follow-up with pt 3/25.  CM will continue to follow pt of any other HH/DME needs.

## 2012-07-07 NOTE — Progress Notes (Signed)
*  PRELIMINARY RESULTS* Echocardiogram 2D Echocardiogram has been performed.  Eric Daniel 07/07/2012, 9:22 AM

## 2012-07-07 NOTE — Progress Notes (Signed)
Brief Nutrition Note:  RD pulled to pt for malnutrition screening tool score of 2, pt indicated weight loss PTA of unknown amount.  Pt states that he has a very physical job and has lost weight from that. Reports he is eating very well.  Diet: Heart Healthy Body mass index is 27.58 kg/(m^2). Overweight  Chart reviewed, no nutrition interventions warranted at this time. Please consult as needed.    Clarene Duke RD, LDN Pager [phone] After Hours pager [phone]

## 2012-07-07 NOTE — Progress Notes (Signed)
Pt had a change in rhythm, did an ECG, showed wide QRS rhythm with premature supraventricular complexes with occasional premature ventricular complexes, left axis deviation, non specific intraventricular block; MD notified, will continue to monitor, thanks, Lavonda Jumbo RN

## 2012-07-07 NOTE — Progress Notes (Signed)
Utilization Review Completed Larrissa Stivers J. Pranit Owensby, RN, BSN, NCM [phone]  

## 2012-07-07 NOTE — Consult Note (Signed)
Advanced Heart Failure Team Consult Note  Referring Physician: Dr Rennis Golden Primary Physician: Dr Clovis Riley Primary Cardiologist:  Dr Rennis Golden  Reason for Consultation: Acute Systolic Heart Failure  HPI:   The heart failure team was asked to provide a consult at the request of Dr Rennis Golden.   In January and February 2014, he was evaluated by his PCP for dyspnea and a cough. He was given steroids, inhaler, and PPI but he says he did not improve. He denies fever or chills. He has continued to work full time and [employer]. Over the weekend he says he had increased dyspnea with exertion. He had to sleep in a recliner due to cough.  He reports 15-20 pound weight loss over the last couple of months. Live at home with his wife and son. 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 No know alcohol or drug abuse.   Admitted to [facility] 07/06/12 with increased dyspnea on exertion. His father is followed closely by HF team due to recent LVAD placementnt. CXR with cardiomegaly noted. CT of chest negative for PE. 07/07/12 ECHO EF 15% severely dilated. Pertinent admission labs included Pro BNP 3526, Troponin 0.43>0.39>0.42, creatinine 1.55, Hemoglobin 14.5, TSH 1.46,  and potassium 4.9. UDS negative. He was given 40 mg IV x 2 and carvedilol 3.125 mg bid was added. Overall his weight is down 6 pounds. - 5.3 liters.   Denies PND. + Orthopnea. Dyspnea with exertion.     Review of Systems: [y] = yes, [ ]  = no   General: Weight gain [ ] ; Weight loss [Y ]; Anorexia [ ] ; Fatigue [ Y]; Fever [ ] ; Chills [ ] ; Weakness [ ]   Cardiac: Chest pain/pressure [ ] ; Resting SOB [ ] ; Exertional SOB [ Y]; Orthopnea [Y ]; Pedal Edema [ ] ; Palpitations [ ] ; Syncope [ ] ; Presyncope [ ] ; Paroxysmal nocturnal dyspnea[ ]   Pulmonary: Cough Gilian.Kraft ]; Wheezing[ ] ; Hemoptysis[ ] ; Sputum [ ] ; Snoring [ ]   GI: Vomiting[ ] ; Dysphagia[ ] ; Melena[ ] ; Hematochezia [ ] ; Heartburn[ ] ; Abdominal pain [ ] ; Constipation [ ] ; Diarrhea [  ]; BRBPR [ ]   GU: Hematuria[ ] ; Dysuria [ ] ; Nocturia[ ]   Vascular: Pain in legs with walking [ ] ; Pain in feet with lying flat [ ] ; Non-healing sores [ ] ; Stroke [ ] ; TIA [ ] ; Slurred speech [ ] ;  Neuro: Headaches[ ] ; Vertigo[ ] ; Seizures[ ] ; Paresthesias[ ] ;Blurred vision [ ] ; Diplopia [ ] ; Vision changes [ ]   Ortho/Skin: Arthritis [ ] ; Joint pain [ ] ; Muscle pain [ ] ; Joint swelling [ ] ; Back Pain [ ] ; Rash [ ]   Psych: Depression[ ] ; Anxiety[ ]   Heme: Bleeding problems [ ] ; Clotting disorders [ ] ; Anemia [ ]   Endocrine: Diabetes [ ] ; Thyroid dysfunction[ ]   Home Medications Prior to Admission medications   Medication Sig Start Date End Date Taking? Authorizing Provider  albuterol (PROVENTIL HFA;VENTOLIN HFA) 108 (90 BASE) MCG/ACT inhaler Inhale 2 puffs into the lungs every 6 (six) hours as needed for wheezing.   Yes Historical Provider, MD  montelukast (SINGULAIR) 4 MG chewable tablet Chew 4 mg by mouth at bedtime.   Yes Historical Provider, MD  pantoprazole (PROTONIX) 20 MG tablet Take 20 mg by mouth daily.   Yes Historical Provider, MD  Simethicone (GAS-X PO) Take 1 tablet by mouth every 4 (four) hours as needed (upset stomach and bloating).   Yes Historical Provider, MD    Past Medical History: History reviewed. No pertinent  past medical history.  Past Surgical History: History reviewed. No pertinent past surgical history.  Family History: Family History  Problem Relation Age of Onset  . Diabetes Mother   . Heart Problems Father   . Autism Brother     Social History: History   Social History  . Marital Status: Married    Spouse Name: N/A    Number of Children: N/A  . Years of Education: N/A   Social History Main Topics  . Smoking status: Never Smoker   . Smokeless tobacco: None  . Alcohol Use: No  . Drug Use: No  . Sexually Active: Yes   Other Topics Concern  . None   Social History Narrative  . None    Allergies:  No Known Allergies  Objective:     Vital Signs:   Temp:  [97.3 F (36.3 C)-98 F (36.7 C)] 97.5 F (36.4 C) (03/24 0500) Pulse Rate:  [60-108] 87 (03/24 0500) Resp:  [16-27] 16 (03/24 0500) BP: (118-136)/(71-100) 128/91 mmHg (03/24 0500) SpO2:  [94 %-100 %] 97 % (03/24 0500) Weight:  [220 lb 10.9 oz (100.1 kg)-226 lb 8 oz (102.74 kg)] 220 lb 10.9 oz (100.1 kg) (03/24 0500) Last BM Date: 07/05/12  Weight change: Filed Weights   07/06/12 1515 07/07/12 0500  Weight: 226 lb 8 oz (102.74 kg) 220 lb 10.9 oz (100.1 kg)    Intake/Output:   Intake/Output Summary (Last 24 hours) at 07/07/12 1147 Last data filed at 07/07/12 0600  Gross per 24 hour  Intake   1320 ml  Output   4500 ml  Net  -3180 ml     Physical Exam: General:  Well appearing. No resp difficulty HEENT: normal Neck: supple. JVP 5-6. Carotids 2+ bilat; no bruits. No lymphadenopathy or thryomegaly appreciated. Cor: PMI nondisplaced. Regular rate & rhythm. No rubs, or murmurs. + S3 Lungs: clear Abdomen: soft, nontender, nondistended. No hepatosplenomegaly. No bruits or masses. Good bowel sounds. Extremities: no cyanosis, clubbing, rash, edema Neuro: alert & orientedx3, cranial nerves grossly intact. moves all 4 extremities w/o difficulty. Affect pleasant  Telemetry: SR   Labs: Basic Metabolic Panel:  Recent Labs Lab 07/06/12 0749 07/06/12 1530 07/07/12 0615  NA 142  --  141  K 4.9  --  4.8  CL 105  --  104  CO2 27  --  30  GLUCOSE 99  --  94  BUN 15  --  15  CREATININE 1.51* 1.45* 1.49*  CALCIUM 9.4  --  9.2  MG  --   --  2.0    Liver Function Tests:  Recent Labs Lab 07/06/12 0749  AST 36  ALT 59*  ALKPHOS 50  BILITOT 1.1  PROT 6.4  ALBUMIN 3.6    Recent Labs Lab 07/06/12 0749  LIPASE 20   No results found for this basename: AMMONIA,  in the last 168 hours  CBC:  Recent Labs Lab 07/06/12 0749 07/06/12 1530  WBC 5.8 6.9  NEUTROABS 3.7  --   HGB 14.5 14.5  HCT 42.2 41.8  MCV 84.9 84.3  PLT 162 155     Cardiac Enzymes:  Recent Labs Lab 07/06/12 0750 07/06/12 0949 07/06/12 1404  CKTOTAL  --   --  124  CKMB  --   --  4.2*  TROPONINI 0.43* 0.39* 0.42*    BNP: BNP (last 3 results)  Recent Labs  07/06/12 0750  PROBNP 3526.0*    CBG: No results found for this basename: GLUCAP,  in the  last 168 hours  Coagulation Studies:  Recent Labs  07/07/12 0615  LABPROT 14.6  INR 1.16    Other results: EKG:JR 107 bpm  Imaging: Dg Chest 2 View  07/06/2012  *RADIOLOGY REPORT*  Clinical Data: Shortness of breath  CHEST - 2 VIEW  Comparison: 04/22/2012  Findings: Moderate cardiomegaly.  Some increase in interstitial edema or infiltrates.  Septal lines seen peripherally at the right lung base.  No effusion.  There is some mild central peribronchial thickening.  Regional bones unremarkable.  IMPRESSION:  Cardiomegaly with interval increase in interstitial edema.   Original Report Authenticated By: D. Andria Rhein, MD    Ct Soft Tissue Neck W Contrast  07/06/2012  *RADIOLOGY REPORT*  Clinical Data: Short of breath, hoarseness, gurgling in throat  CT NECK WITH CONTRAST  Technique:  Multidetector CT imaging of the neck was performed with intravenous contrast.  Contrast: 80mL OMNIPAQUE IOHEXOL 350 MG/ML SOLN .  Comparison: CT thorax same day  Findings: The oropharynx and hypopharynx are normal.  The epiglottis and glottis are normal.  The subglottic tissues are normal.  The parapharyngeal space is normal.  The pharyngeal mucosa is symmetric.  Submandibular glands and parotid glands are normal.  The prevertebral space is normal.  No evidence of lymphadenopathy in the neck.  There is no aggressive osseous lesions.  There is a polypoid mucosal lesion in the right maxillary sinus measuring 3.2 cm.  Limited view of the lung apices demonstrates interlobular septal thickening.  Mild mediastinal lymphadenopathy.  No vascular abnormality in the neck.  IMPRESSION:  1.  No abnormality of the hypopharynx and  glottis. 2.  No evidence of lymphadenopathy in neck. 3.  Interlobular septal thickening seen in the lung apices consistent with interstitial edema. 4.  Likely  mucous retention cyst within the right maxillary sinus.   Original Report Authenticated By: Genevive Bi, M.D.    Ct Angio Chest Pe W/cm &/or Wo Cm  07/06/2012  *RADIOLOGY REPORT*  Clinical Data: Shortness of breath, hoarseness.  CT ANGIOGRAPHY CHEST  Technique:  Multidetector CT imaging of the chest using the standard protocol during bolus administration of intravenous contrast. Multiplanar reconstructed images including MIPs were obtained and reviewed to evaluate the vascular anatomy.  Contrast: 80mL OMNIPAQUE IOHEXOL 350 MG/ML SOLN  Comparison: None.  Findings: There is good contrast opacification of the pulmonary artery branches.  No discrete filling defect to suggest acute PE.There is incomplete opacification of the thoracic aorta, which is normal in caliber.  There is four-chamber cardiac enlargement. Small bilateral pleural effusions.  Reflux of contrast from the right atrium into hepatic veins suggesting a degree of right heart dysfunction.  14 mm prevascular lymph node, 11 mm precarinal lymph node, 11 mm pretracheal lymph node.  No hilar adenopathy.  Patchy ground-glass opacities in basilar segments of both lower lobes, with septal thickening seen medially in the upper and lower lobes. No confluent airspace consolidation.  Thoracic spine and sternum intact.  IMPRESSION:  1.  Negative for acute PE. 2.  Small pleural effusions, septal thickening, contrast reflux into hepatic veins, and cardiomegaly suggesting CHF. 3.  Nonspecific mediastinal adenopathy.   Original Report Authenticated By: D. Andria Rhein, MD       Medications:     Current Medications: . aspirin EC  81 mg Oral Daily  . budesonide-formoterol  2 puff Inhalation BID  . carvedilol  3.125 mg Oral BID WC  . enoxaparin (LOVENOX) injection  40 mg Subcutaneous QHS  .  montelukast  4 mg  Oral QHS  . pantoprazole  20 mg Oral Q1200  . pneumococcal 23 valent vaccine  0.5 mL Intramuscular Tomorrow-1000     Infusions:      Assessment:   1. Acute Systolic Heart Failure- 07/07/12  EF 15% 2. Acute Renal Failure 3. HTN    Plan/Discussion:    Mr Markovic is 37 year old with acute systolic heart failure of unknown etiology.  Diuresed with IV lasix. Volume status appears stable. Stop diuretics. Continue current dose of carvedilol. Add hydralazine/IMDUR for afterload reduction. No Ace Inhibitor due to renal failure. Add digoxin 0.125 mg daily.   Check SPEP, UPEP  Plan RHC/LHC tomorrow. Watch renal function closely.   Length of Stay: 1  CLEGG,AMY NP-C 07/07/2012, 11:47 AM  Patient seen and examined with Tonye Becket, NP. We discussed all aspects of the encounter. I agree with the assessment and plan as stated above.   I have reviewed echo personally. He has severe LV dysfunction with restrictive diastolic parameters and mild to moderate RV dysfunction. He is admitted with Class IV HF. 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 (He has mild LVH on echo but BP reported to be normal in past).  Will need RHC/LHC tomorrow and LifeVest prior to d/c.   Agree with low-dose carvedilol and hydralazine/nitrates. No ACE-I now due to renal dysfunction.   Mercy Leppla,MD 1:28 PM

## 2012-07-08 ENCOUNTER — Encounter (HOSPITAL_COMMUNITY)
Admission: EM | Disposition: A | Discharge status: Home / Self Care | Payer: Self-pay | Source: Home / Self Care | Attending: Internal Medicine

## 2012-07-08 DIAGNOSIS — I509 Heart failure, unspecified: Secondary | ICD-10-CM

## 2012-07-08 HISTORY — PX: LEFT AND RIGHT HEART CATHETERIZATION WITH CORONARY ANGIOGRAM: SHX5449

## 2012-07-08 LAB — BASIC METABOLIC PANEL
BUN: 17 mg/dL (ref 6–23)
Calcium: 9.1 mg/dL (ref 8.4–10.5)
Creatinine, Ser: 1.51 mg/dL — ABNORMAL HIGH (ref 0.50–1.35)
GFR calc Af Amer: 67 mL/min — ABNORMAL LOW (ref >90)
GFR calc non Af Amer: 58 mL/min — ABNORMAL LOW (ref >90)
Glucose, Bld: 92 mg/dL (ref 70–99)
Potassium: 4 mEq/L (ref 3.5–5.1)

## 2012-07-08 LAB — POCT I-STAT 3, VENOUS BLOOD GAS (G3P V)
Acid-Base Excess: 1 mmol/L (ref 0.0–2.0)
Acid-Base Excess: 1 mmol/L (ref 0.0–2.0)
Bicarbonate: 26.1 mEq/L — ABNORMAL HIGH (ref 20.0–24.0)
O2 Saturation: 64 %
TCO2: 27 mmol/L (ref 0–100)
pH, Ven: 7.4 — ABNORMAL HIGH (ref 7.250–7.300)

## 2012-07-08 LAB — CREATININE, SERUM
Creatinine, Ser: 1.46 mg/dL — ABNORMAL HIGH (ref 0.50–1.35)
GFR calc non Af Amer: 60 mL/min — ABNORMAL LOW (ref >90)

## 2012-07-08 LAB — POCT I-STAT 3, ART BLOOD GAS (G3+)
Acid-Base Excess: 1 mmol/L (ref 0.0–2.0)
Bicarbonate: 25.6 mEq/L — ABNORMAL HIGH (ref 20.0–24.0)
O2 Saturation: 96 %
TCO2: 27 mmol/L (ref 0–100)
pCO2 arterial: 38.1 mmHg (ref 35.0–45.0)
pO2, Arterial: 78 mmHg — ABNORMAL LOW (ref 80.0–100.0)

## 2012-07-08 LAB — CBC
Hemoglobin: 13.6 g/dL (ref 13.0–17.0)
MCH: 28.7 pg (ref 26.0–34.0)
Platelets: 161 10*3/uL (ref 150–400)
RBC: 4.74 MIL/uL (ref 4.22–5.81)

## 2012-07-08 LAB — TYPE AND SCREEN
ABO/RH(D): O POS
Antibody Screen: NEGATIVE

## 2012-07-08 SURGERY — LEFT AND RIGHT HEART CATHETERIZATION WITH CORONARY ANGIOGRAM
Anesthesia: LOCAL

## 2012-07-08 MED ORDER — MIDAZOLAM HCL 2 MG/2ML IJ SOLN
INTRAMUSCULAR | Status: AC
  Filled 2012-07-08: qty 2

## 2012-07-08 MED ORDER — SODIUM CHLORIDE 0.9 % IJ SOLN: 3.0000 mL | INTRAMUSCULAR | Status: DC | PRN

## 2012-07-08 MED ORDER — ACETAMINOPHEN 325 MG PO TABS
650.0000 mg | ORAL_TABLET | ORAL | Status: DC | PRN
  Administered 2012-07-09: 650 mg via ORAL

## 2012-07-08 MED ORDER — FENTANYL CITRATE 0.05 MG/ML IJ SOLN
INTRAMUSCULAR | Status: AC
  Filled 2012-07-08: qty 2

## 2012-07-08 MED ORDER — ACETAMINOPHEN 325 MG PO TABS
ORAL_TABLET | ORAL | Status: AC
  Filled 2012-07-08: qty 2

## 2012-07-08 MED ORDER — ONDANSETRON HCL 4 MG/2ML IJ SOLN
4.0000 mg | Freq: Four times a day (QID) | INTRAMUSCULAR | Status: DC | PRN
  Administered 2012-07-09: 4 mg via INTRAVENOUS
  Filled 2012-07-08: qty 2

## 2012-07-08 MED ORDER — HYDRALAZINE HCL 25 MG PO TABS
25.0000 mg | ORAL_TABLET | Freq: Three times a day (TID) | ORAL | Status: DC
  Administered 2012-07-08 – 2012-07-10 (×5): 25 mg via ORAL
  Filled 2012-07-08 (×8): qty 1

## 2012-07-08 MED ORDER — LIDOCAINE HCL (PF) 1 % IJ SOLN
INTRAMUSCULAR | Status: AC
  Filled 2012-07-08: qty 30

## 2012-07-08 MED ORDER — PNEUMOCOCCAL VAC POLYVALENT 25 MCG/0.5ML IJ INJ
0.5000 mL | INJECTION | INTRAMUSCULAR | Status: AC
  Filled 2012-07-08: qty 0.5

## 2012-07-08 MED ORDER — SODIUM CHLORIDE 0.9 % IV SOLN: 250.0000 mL | INTRAVENOUS | Status: DC | PRN

## 2012-07-08 MED ORDER — SODIUM CHLORIDE 0.9 % IJ SOLN
3.0000 mL | Freq: Two times a day (BID) | INTRAMUSCULAR | Status: DC
  Administered 2012-07-08 – 2012-07-09 (×3): 3 mL via INTRAVENOUS

## 2012-07-08 MED ORDER — FUROSEMIDE 10 MG/ML IJ SOLN
80.0000 mg | Freq: Two times a day (BID) | INTRAMUSCULAR | Status: DC
  Administered 2012-07-08 – 2012-07-09 (×2): 80 mg via INTRAVENOUS
  Filled 2012-07-08 (×3): qty 8

## 2012-07-08 MED ORDER — HEPARIN SODIUM (PORCINE) 5000 UNIT/ML IJ SOLN
5000.0000 [IU] | Freq: Three times a day (TID) | INTRAMUSCULAR | Status: DC
  Administered 2012-07-08 – 2012-07-10 (×5): 5000 [IU] via SUBCUTANEOUS
  Filled 2012-07-08 (×8): qty 1

## 2012-07-08 MED ORDER — HEPARIN (PORCINE) IN NACL 2-0.9 UNIT/ML-% IJ SOLN
INTRAMUSCULAR | Status: AC
  Filled 2012-07-08: qty 1000

## 2012-07-08 NOTE — Progress Notes (Signed)
C/O feeling bad . Pt had 9 bt run VT.  Dr. Gala Romney notified. VS WNL.

## 2012-07-08 NOTE — Clinical Social Work Psychosocial (Addendum)
    Clinical Social Work Department BRIEF PSYCHOSOCIAL ASSESSMENT 07/08/2012  Patient:  Eric Daniel, Eric Daniel     Account Number:  [id number]     Admit date:  07/06/2012  Clinical Social Worker:  Tiburcio Pea  Date/Time:  07/07/2012 02:26 PM  Referred by:  Physician  Date Referred:  07/07/2012 Referred for  Other - See comment   Other Referral:   Disability issues?   Interview type:  Other - See comment Other interview type:    PSYCHOSOCIAL DATA Living Status:  FAMILY Admitted from facility:   Level of care:   Primary support name:  [employer] Primary support relationship to patient:  SPOUSE Degree of support available:   Strong support    CURRENT CONCERNS Current Concerns  Other - See comment   Other Concerns:    SOCIAL WORK ASSESSMENT / PLAN CSW met with patient, his wife and daughter today. Patient is well known to this patient prior to this hospitalization as his father recently underwent a VAD procedure here at [facility] and this CSW worked with patient, his father and their family extensively.  Patient will undergo a cardiac cath tomorrow; he will also need a Life Vest.  CSW planned to talk to patient about possible need to initiate disability; he is currently insured with [employer] and he works full time.  He has a very positive attitude at this time and seems to minimalize the extent of his current heart/medical issues. Patient stated intent to return to work and that he feels that "everything will come out fine."  CSW attempted to bring up subject of possible need for disability but at this point- patient has full confidence that he will be "better soon."  CSW did not push the issue as his young daughter was in the room - however- this issue will need to be revisited in the near future.  CSW can follow up with patient either in the hospital or in the HF clinic in the future.  He denied any needs at this time.   Assessment/plan status:  No Further Intervention  Required Other assessment/ plan:   Information/referral to community resources:    PATIENT'S/FAMILY'S RESPONSE TO PLAN OF CARE: Patient is alert, oriented and extremely pleasant. He will have a cardiac cath in the a.m. and states that he does not feel that they will find anything of significance. He currently has an EF of 15% and will require a Life Vest. Patient appears to be somewhat in denial at this time and will require time to process information about his medical condition as well as to consider future options.  Patien is only 37 years old; he has a family, works full time and is physically active. He will require a great deal of support in the future to help him to adjust to his medical condition.   Patient defers any current CSW needs and stated that he would call if needed.  CSW will attempt to revisit patient after catherization tomrrow if he returns to [address].

## 2012-07-08 NOTE — Interval H&P Note (Signed)
History and Physical Interval Note:  07/08/2012 4:45 PM  Eric Daniel  has presented today for surgery, with the diagnosis of CHF  The various methods of treatment have been discussed with the patient and family. After consideration of risks, benefits and other options for treatment, the patient has consented to  Procedure(s): LEFT AND RIGHT HEART CATHETERIZATION WITH CORONARY ANGIOGRAM (N/A) as a surgical intervention .  The patient's history has been reviewed, patient examined, no change in status, stable for surgery.  I have reviewed the patient's chart and labs.  Questions were answered to the patient's satisfaction.     Ernest Orr

## 2012-07-08 NOTE — H&P (View-Only) (Signed)
Advanced Heart Failure Team Consult Note  Referring Physician: Dr Hilty Primary Physician: Dr Mitchell Primary Cardiologist:  Dr Hilty  Reason for Consultation: Acute Systolic Heart Failure  HPI:   The heart failure team was asked to provide a consult at the request of Dr Hilty.   In January and February 2014, he was evaluated by his PCP for dyspnea and a cough. He was given steroids, inhaler, and PPI but he says he did not improve. He denies fever or chills. 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 Over the weekend he says he had increased dyspnea with exertion. He had to sleep in a recliner due to cough.  He reports 15-20 pound weight loss over the last couple of months. Live at home with his wife and son. 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 No know alcohol or drug abuse.   Admitted to [facility] 07/06/12 with increased dyspnea on exertion. His father is followed closely by HF team due to recent LVAD placementnt. CXR with cardiomegaly noted. CT of chest negative for PE. 07/07/12 ECHO EF 15% severely dilated. Pertinent admission labs included Pro BNP 3526, Troponin 0.43>0.39>0.42, creatinine 1.55, Hemoglobin 14.5, TSH 1.46,  and potassium 4.9. UDS negative. He was given 40 mg IV x 2 and carvedilol 3.125 mg bid was added. Overall his weight is down 6 pounds. - 5.3 liters.   Denies PND. + Orthopnea. Dyspnea with exertion.     Review of Systems: [y] = yes, [ ] = no   General: Weight gain [ ]; Weight loss [Y ]; Anorexia [ ]; Fatigue [ Y]; Fever [ ]; Chills [ ]; Weakness [ ]  Cardiac: Chest pain/pressure [ ]; Resting SOB [ ]; Exertional SOB [ Y]; Orthopnea [Y ]; Pedal Edema [ ]; Palpitations [ ]; Syncope [ ]; Presyncope [ ]; Paroxysmal nocturnal dyspnea[ ]  Pulmonary: Cough [Y ]; Wheezing[ ]; Hemoptysis[ ]; Sputum [ ]; Snoring [ ]  GI: Vomiting[ ]; Dysphagia[ ]; Melena[ ]; Hematochezia [ ]; Heartburn[ ]; Abdominal pain [ ]; Constipation [ ]; Diarrhea [  ]; BRBPR [ ]  GU: Hematuria[ ]; Dysuria [ ]; Nocturia[ ]  Vascular: Pain in legs with walking [ ]; Pain in feet with lying flat [ ]; Non-healing sores [ ]; Stroke [ ]; TIA [ ]; Slurred speech [ ];  Neuro: Headaches[ ]; Vertigo[ ]; Seizures[ ]; Paresthesias[ ];Blurred vision [ ]; Diplopia [ ]; Vision changes [ ]  Ortho/Skin: Arthritis [ ]; Joint pain [ ]; Muscle pain [ ]; Joint swelling [ ]; Back Pain [ ]; Rash [ ]  Psych: Depression[ ]; Anxiety[ ]  Heme: Bleeding problems [ ]; Clotting disorders [ ]; Anemia [ ]  Endocrine: Diabetes [ ]; Thyroid dysfunction[ ]  Home Medications Prior to Admission medications   Medication Sig Start Date End Date Taking? Authorizing Provider  albuterol (PROVENTIL HFA;VENTOLIN HFA) 108 (90 BASE) MCG/ACT inhaler Inhale 2 puffs into the lungs every 6 (six) hours as needed for wheezing.   Yes Historical Provider, MD  montelukast (SINGULAIR) 4 MG chewable tablet Chew 4 mg by mouth at bedtime.   Yes Historical Provider, MD  pantoprazole (PROTONIX) 20 MG tablet Take 20 mg by mouth daily.   Yes Historical Provider, MD  Simethicone (GAS-X PO) Take 1 tablet by mouth every 4 (four) hours as needed (upset stomach and bloating).   Yes Historical Provider, MD    Past Medical History: History reviewed. No pertinent   past medical history.  Past Surgical History: History reviewed. No pertinent past surgical history.  Family History: Family History  Problem Relation Age of Onset  . Diabetes Mother   . Heart Problems Father   . Autism Brother     Social History: History   Social History  . Marital Status: Married    Spouse Name: N/A    Number of Children: N/A  . Years of Education: N/A   Social History Main Topics  . Smoking status: Never Smoker   . Smokeless tobacco: None  . Alcohol Use: No  . Drug Use: No  . Sexually Active: Yes   Other Topics Concern  . None   Social History Narrative  . None    Allergies:  No Known Allergies  Objective:     Vital Signs:   Temp:  [97.3 F (36.3 C)-98 F (36.7 C)] 97.5 F (36.4 C) (03/24 0500) Pulse Rate:  [60-108] 87 (03/24 0500) Resp:  [16-27] 16 (03/24 0500) BP: (118-136)/(71-100) 128/91 mmHg (03/24 0500) SpO2:  [94 %-100 %] 97 % (03/24 0500) Weight:  [220 lb 10.9 oz (100.1 kg)-226 lb 8 oz (102.74 kg)] 220 lb 10.9 oz (100.1 kg) (03/24 0500) Last BM Date: 07/05/12  Weight change: Filed Weights   07/06/12 1515 07/07/12 0500  Weight: 226 lb 8 oz (102.74 kg) 220 lb 10.9 oz (100.1 kg)    Intake/Output:   Intake/Output Summary (Last 24 hours) at 07/07/12 1147 Last data filed at 07/07/12 0600  Gross per 24 hour  Intake   1320 ml  Output   4500 ml  Net  -3180 ml     Physical Exam: General:  Well appearing. No resp difficulty HEENT: normal Neck: supple. JVP 5-6. Carotids 2+ bilat; no bruits. No lymphadenopathy or thryomegaly appreciated. Cor: PMI nondisplaced. Regular rate & rhythm. No rubs, or murmurs. + S3 Lungs: clear Abdomen: soft, nontender, nondistended. No hepatosplenomegaly. No bruits or masses. Good bowel sounds. Extremities: no cyanosis, clubbing, rash, edema Neuro: alert & orientedx3, cranial nerves grossly intact. moves all 4 extremities w/o difficulty. Affect pleasant  Telemetry: SR   Labs: Basic Metabolic Panel:  Recent Labs Lab 07/06/12 0749 07/06/12 1530 07/07/12 0615  NA 142  --  141  K 4.9  --  4.8  CL 105  --  104  CO2 27  --  30  GLUCOSE 99  --  94  BUN 15  --  15  CREATININE 1.51* 1.45* 1.49*  CALCIUM 9.4  --  9.2  MG  --   --  2.0    Liver Function Tests:  Recent Labs Lab 07/06/12 0749  AST 36  ALT 59*  ALKPHOS 50  BILITOT 1.1  PROT 6.4  ALBUMIN 3.6    Recent Labs Lab 07/06/12 0749  LIPASE 20   No results found for this basename: AMMONIA,  in the last 168 hours  CBC:  Recent Labs Lab 07/06/12 0749 07/06/12 1530  WBC 5.8 6.9  NEUTROABS 3.7  --   HGB 14.5 14.5  HCT 42.2 41.8  MCV 84.9 84.3  PLT 162 155     Cardiac Enzymes:  Recent Labs Lab 07/06/12 0750 07/06/12 0949 07/06/12 1404  CKTOTAL  --   --  124  CKMB  --   --  4.2*  TROPONINI 0.43* 0.39* 0.42*    BNP: BNP (last 3 results)  Recent Labs  07/06/12 0750  PROBNP 3526.0*    CBG: No results found for this basename: GLUCAP,  in the   last 168 hours  Coagulation Studies:  Recent Labs  07/07/12 0615  LABPROT 14.6  INR 1.16    Other results: EKG:JR 107 bpm  Imaging: Dg Chest 2 View  07/06/2012  *RADIOLOGY REPORT*  Clinical Data: Shortness of breath  CHEST - 2 VIEW  Comparison: 04/22/2012  Findings: Moderate cardiomegaly.  Some increase in interstitial edema or infiltrates.  Septal lines seen peripherally at the right lung base.  No effusion.  There is some mild central peribronchial thickening.  Regional bones unremarkable.  IMPRESSION:  Cardiomegaly with interval increase in interstitial edema.   Original Report Authenticated By: D. Hassell III, MD    Ct Soft Tissue Neck W Contrast  07/06/2012  *RADIOLOGY REPORT*  Clinical Data: Short of breath, hoarseness, gurgling in throat  CT NECK WITH CONTRAST  Technique:  Multidetector CT imaging of the neck was performed with intravenous contrast.  Contrast: 80mL OMNIPAQUE IOHEXOL 350 MG/ML SOLN .  Comparison: CT thorax same day  Findings: The oropharynx and hypopharynx are normal.  The epiglottis and glottis are normal.  The subglottic tissues are normal.  The parapharyngeal space is normal.  The pharyngeal mucosa is symmetric.  Submandibular glands and parotid glands are normal.  The prevertebral space is normal.  No evidence of lymphadenopathy in the neck.  There is no aggressive osseous lesions.  There is a polypoid mucosal lesion in the right maxillary sinus measuring 3.2 cm.  Limited view of the lung apices demonstrates interlobular septal thickening.  Mild mediastinal lymphadenopathy.  No vascular abnormality in the neck.  IMPRESSION:  1.  No abnormality of the hypopharynx and  glottis. 2.  No evidence of lymphadenopathy in neck. 3.  Interlobular septal thickening seen in the lung apices consistent with interstitial edema. 4.  Likely  mucous retention cyst within the right maxillary sinus.   Original Report Authenticated By: Stewart Edmunds, M.D.    Ct Angio Chest Pe W/cm &/or Wo Cm  07/06/2012  *RADIOLOGY REPORT*  Clinical Data: Shortness of breath, hoarseness.  CT ANGIOGRAPHY CHEST  Technique:  Multidetector CT imaging of the chest using the standard protocol during bolus administration of intravenous contrast. Multiplanar reconstructed images including MIPs were obtained and reviewed to evaluate the vascular anatomy.  Contrast: 80mL OMNIPAQUE IOHEXOL 350 MG/ML SOLN  Comparison: None.  Findings: There is good contrast opacification of the pulmonary artery branches.  No discrete filling defect to suggest acute PE.There is incomplete opacification of the thoracic aorta, which is normal in caliber.  There is four-chamber cardiac enlargement. Small bilateral pleural effusions.  Reflux of contrast from the right atrium into hepatic veins suggesting a degree of right heart dysfunction.  14 mm prevascular lymph node, 11 mm precarinal lymph node, 11 mm pretracheal lymph node.  No hilar adenopathy.  Patchy ground-glass opacities in basilar segments of both lower lobes, with septal thickening seen medially in the upper and lower lobes. No confluent airspace consolidation.  Thoracic spine and sternum intact.  IMPRESSION:  1.  Negative for acute PE. 2.  Small pleural effusions, septal thickening, contrast reflux into hepatic veins, and cardiomegaly suggesting CHF. 3.  Nonspecific mediastinal adenopathy.   Original Report Authenticated By: D. Hassell III, MD       Medications:     Current Medications: . aspirin EC  81 mg Oral Daily  . budesonide-formoterol  2 puff Inhalation BID  . carvedilol  3.125 mg Oral BID WC  . enoxaparin (LOVENOX) injection  40 mg Subcutaneous QHS  .  montelukast  4 mg   Oral QHS  . pantoprazole  20 mg Oral Q1200  . pneumococcal 23 valent vaccine  0.5 mL Intramuscular Tomorrow-1000     Infusions:      Assessment:   1. Acute Systolic Heart Failure- 07/07/12  EF 15% 2. Acute Renal Failure 3. HTN    Plan/Discussion:    Eric Daniel is 36 year old with acute systolic heart failure of unknown etiology.  Diuresed with IV lasix. Volume status appears stable. Stop diuretics. Continue current dose of carvedilol. Add hydralazine/IMDUR for afterload reduction. No Ace Inhibitor due to renal failure. Add digoxin 0.125 mg daily.   Check SPEP, UPEP  Plan RHC/LHC tomorrow. Watch renal function closely.   Length of Stay: 1  CLEGG,AMY NP-C 07/07/2012, 11:47 AM  Patient seen and examined with Amy Clegg, NP. We discussed all aspects of the encounter. I agree with the assessment and plan as stated above.   I have reviewed echo personally. He has severe LV dysfunction with restrictive diastolic parameters and mild to moderate RV dysfunction. He is admitted with Class IV HF. 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 (He has mild LVH on echo but BP reported to be normal in past).  Will need RHC/LHC tomorrow and LifeVest prior to d/c.   Agree with low-dose carvedilol and hydralazine/nitrates. No ACE-I now due to renal dysfunction.   Eric Debenedetto,MD 1:28 PM      

## 2012-07-08 NOTE — CV Procedure (Signed)
Cardiac Cath Procedure Note  Indication: HF  Procedures performed:  1) Right heart cathererization 2) Selective coronary angiography 3) Left heart catheterization 4) Left ventriculogram  Description of procedure:     The risks and indication of the procedure were explained. Consent was signed and placed on the chart. An appropriate timeout was taken prior to the procedure. The right groin was prepped and draped in the routine sterile fashion and anesthetized with 1% local lidocaine.   A 5 FR arterial sheath was placed in the right femoral artery using a modified Seldinger technique. Standard catheters including a JL4, JR4 and angled pigtail were used. All catheter exchanges were made over a wire. A 7 FR venous sheath was placed in the right femoral vein using a modified Seldinger technique. A standard Swan-Ganz catheter was used for the procedure.   Total contrast 25 cc  Complications:  None apparent  Findings:  RA =  13 RV = 62/11/145 PA = 61/36 (47) PCW = 28 Fick cardiac output/index = 5.0/2.2 Thermo CO/CI 5.8/2.5 SVR = 1292  PVR = 3.3 Woods FA sat = 96% PA sat = 64%, 67%  Ao Pressure: 120/94 (107) LV Pressure:  132/27/38 There was no signficant gradient across the aortic valve on pullback.  Left main: Normal  LAD: Normal  LCX: Normal   RCA: Dominant. normal  LV-gram done in the RAO projection but hand injection: LV markedly dilated. Poorly opacified. Unable to estimate EF. EF 15% by echo  Assessment: 1. Severe nonischemic CM 2. Moderately to markedly elevated biventricular filling pressures 3. Low normal cardiac output  Plan/Discussion:  He remains tenuous. Cardiac output better than I thought it would be. Continue to titrate cardiac meds. Continue diuresis. Suspect he may need inotropes soon. LifeVest prior to d/c  [name], MD 5:05 PM

## 2012-07-08 NOTE — Progress Notes (Signed)
1355 Read progress note of 9 beats VT. Talked with pt's RN. Pt not feeling well. For cath today. Will follow up. Colon Flattery BSN

## 2012-07-09 ENCOUNTER — Inpatient Hospital Stay (HOSPITAL_COMMUNITY): Payer: PRIVATE HEALTH INSURANCE

## 2012-07-09 LAB — CBC
MCH: 29.1 pg (ref 26.0–34.0)
MCHC: 35.1 g/dL (ref 30.0–36.0)
Platelets: 170 10*3/uL (ref 150–400)
RBC: 4.95 MIL/uL (ref 4.22–5.81)

## 2012-07-09 LAB — BASIC METABOLIC PANEL
BUN: 19 mg/dL (ref 6–23)
Calcium: 9 mg/dL (ref 8.4–10.5)
GFR calc Af Amer: 67 mL/min — ABNORMAL LOW (ref >90)
GFR calc non Af Amer: 57 mL/min — ABNORMAL LOW (ref >90)
Glucose, Bld: 92 mg/dL (ref 70–99)
Potassium: 4 mEq/L (ref 3.5–5.1)
Sodium: 137 mEq/L (ref 135–145)

## 2012-07-09 MED ORDER — HYDROMORPHONE HCL PF 1 MG/ML IJ SOLN: 1.0000 mg | Freq: Once | INTRAMUSCULAR | Status: DC

## 2012-07-09 MED ORDER — POLYETHYLENE GLYCOL 3350 17 G PO PACK
17.0000 g | PACK | Freq: Every day | ORAL | Status: DC | PRN
  Administered 2012-07-09: 17 g via ORAL
  Filled 2012-07-09: qty 1

## 2012-07-09 MED ORDER — SPIRONOLACTONE 12.5 MG HALF TABLET
12.5000 mg | ORAL_TABLET | Freq: Every day | ORAL | Status: DC
  Administered 2012-07-09 – 2012-07-10 (×2): 12.5 mg via ORAL
  Filled 2012-07-09 (×2): qty 1

## 2012-07-09 MED ORDER — ISOSORBIDE MONONITRATE ER 30 MG PO TB24: 30.0000 mg | ORAL_TABLET | Freq: Every day | ORAL | Status: DC

## 2012-07-09 MED ORDER — FUROSEMIDE 40 MG PO TABS
40.0000 mg | ORAL_TABLET | Freq: Two times a day (BID) | ORAL | Status: DC
  Administered 2012-07-09 – 2012-07-10 (×2): 40 mg via ORAL
  Filled 2012-07-09 (×4): qty 1

## 2012-07-09 MED ORDER — POTASSIUM CHLORIDE CRYS ER 20 MEQ PO TBCR: 20.0000 meq | EXTENDED_RELEASE_TABLET | Freq: Two times a day (BID) | ORAL | Status: DC

## 2012-07-09 MED ORDER — ACETAMINOPHEN 325 MG PO TABS: 650.0000 mg | ORAL_TABLET | Freq: Four times a day (QID) | ORAL | Status: DC | PRN

## 2012-07-09 NOTE — Progress Notes (Signed)
Complaining of severe headache associated with IMDUR.   Stop IMDUR and give tylenol.   Stacie Templin NP-C 2:10 PM

## 2012-07-09 NOTE — Discharge Summary (Addendum)
Advanced Heart Failure Team  Discharge Summary   Patient ID: ERI PLATTEN MRN: [id number], DOB/AGE: December 13, 1975 37 y.o. Admit date: 07/06/2012 D/C date:     07/10/2012   Primary Discharge Diagnoses:  1. Acute Systolic Heart Failure- 07/07/12 EF 15%  2. Acute Renal Failure  3. HTN     Hospital Course:  Mr Auker is a 37 year old admitted with acute HF. Found to have EF 15% with restrictive diastolic parameters and mild to moderate RV dysfunction. 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   He was admitted to [facility] 07/06/12 with increased dyspnea on exertion. His father is followed closely by HF team due to recent LVAD placementnt. CXR with cardiomegaly noted. CT of chest negative for PE. 07/07/12 ECHO EF 15% severely dilated. Pertinent admission labs included Pro BNP 3526, Troponin 0.43>0.39>0.42, creatinine 1.55, Hemoglobin 14.5, TSH 1.46, and potassium 4.9. UDS negative.   He was diuresed with IV lasix and transitioned to po lasix and started on HF medications. As noted below he had normal coronaries, elevated filling pressures, and low normal cardiac output.  He will not be placed on ace inhibitor due to renal failure. Hydralazine will be used for afterload reduction.  He was intolerant IMDUR due to severe headaches.He developed severe n/v with HAs. Head CT done and was ok.  He diuresed 10.3 liters. Overall his weight is down 10 pounds. Discharge Pro BNP 1462. Discharge weight 216 pounds.   A life vest was placed prior to discharge. He received extensive HF education on medication compliance, daily weights, limiting fluid intake to less than 2 liters per day,and low salt food choices. He will continue to be followed closely in the HF clinic. Plan to repeat ECHO after HF medications optimized. Will refer to [facility] for transplant evaluation. Given O positive blood type will likely need bridge VAD. I suspect he may need more diuretics as outpatient. Will follow closely.     RHC/LHC 07/08/12 RA = 13  RV = 62/11/145  PA =  61/36 (47)  PCW = 28  Fick cardiac output/index = 5.0/2.2  Thermo CO/CI 5.8/2.5  SVR = 1292  PVR = 3.3 Woods  FA sat = 96%  PA sat = 64%, 67%    Discharge Weight Range:  Discharge 216 pounds  Discharge Vitals: Blood pressure 110/77, pulse 71, temperature 97.6 F (36.4 C), temperature source Oral, resp. rate 19, height 6\' 3"  (1.905 m), weight 216 lb 14.4 oz (98.385 kg), SpO2 99.00%.  Physical Exam:  General: Well appearing. No resp difficulty  HEENT: normal  Neck: supple. JVP 5-6. Carotids 2+ bilat; no bruits. No lymphadenopathy or thryomegaly appreciated.  Cor: PMI nondisplaced. Regular rate & rhythm. No rubs, or murmurs. + S3  Lungs: clear  Abdomen: soft, nontender, nondistended. No hepatosplenomegaly. No bruits or masses. Good bowel sounds.  Extremities: no cyanosis, clubbing, rash, edema. Groin site ok  Neuro: alert & orientedx3, cranial nerves grossly intact. moves all 4 extremities w/o difficulty. Affect pleasant  Telemetry: SR       Labs: Lab Results  Component Value Date   WBC 5.4 07/09/2012   HGB 14.4 07/09/2012   HCT 41.0 07/09/2012   MCV 82.8 07/09/2012   PLT 170 07/09/2012    Recent Labs Lab 07/06/12 0749  07/09/12 0857  NA 142  < > 137  K 4.9  < > 4.0  CL 105  < > 98  CO2 27  < > 31  BUN 15  < > 19  CREATININE 1.51*  < >  1.52*  CALCIUM 9.4  < > 9.0  PROT 6.4  --   --   BILITOT 1.1  --   --   ALKPHOS 50  --   --   ALT 59*  --   --   AST 36  --   --   GLUCOSE 99  < > 92  < > = values in this interval not displayed. No results found for this basename: CHOL,  HDL,  LDLCALC,  TRIG   BNP (last 3 results)  Recent Labs  07/06/12 0750 07/10/12 0400  PROBNP 3526.0* 1462.0*    Diagnostic Studies/Procedures   Ct Head Wo Contrast  07/09/2012  *RADIOLOGY REPORT*  Clinical Data: Severe headache.  CT HEAD WITHOUT CONTRAST  Technique:  Contiguous axial images were obtained from the base of the skull through the vertex without contrast.  Comparison:  06/24/2004  Findings: The ventricles are normal.  No extra-axial fluid collections are seen.  The brainstem and cerebellum are unremarkable.  No acute intracranial findings such as infarction or hemorrhage.  No mass lesions.  The bony calvarium is intact. Stable mucous retention cyst or polyp in the right maxillary sinus.  Remaining paranasal sinuses and mastoid air cells are clear.  IMPRESSION: No acute intracranial findings or mass lesion.   Original Report Authenticated By: Rudie Meyer, M.D.     Discharge Medications     Medication List    STOP taking these medications       budesonide-formoterol 160-4.5 MCG/ACT inhaler  Commonly known as:  SYMBICORT      TAKE these medications       albuterol 108 (90 BASE) MCG/ACT inhaler  Commonly known as:  PROVENTIL HFA;VENTOLIN HFA  Inhale 2 puffs into the lungs every 6 (six) hours as needed for wheezing.     carvedilol 3.125 MG tablet  Commonly known as:  COREG  Take 1 tablet (3.125 mg total) by mouth 2 (two) times daily with a meal.     digoxin 0.125 MG tablet  Commonly known as:  LANOXIN  Take 1 tablet (0.125 mg total) by mouth daily.     furosemide 40 MG tablet  Commonly known as:  LASIX  Take 1 tablet (40 mg total) by mouth daily.     GAS-X PO  Take 1 tablet by mouth every 4 (four) hours as needed (upset stomach and bloating).     hydrALAZINE 25 MG tablet  Commonly known as:  APRESOLINE  Take 1 tablet (25 mg total) by mouth every 8 (eight) hours.     montelukast 4 MG chewable tablet  Commonly known as:  SINGULAIR  Chew 4 mg by mouth at bedtime.     pantoprazole 20 MG tablet  Commonly known as:  PROTONIX  Take 20 mg by mouth daily.        Disposition   The patient will be discharged in stable condition to home. Discharge Orders   Future Appointments Provider Department Dept Phone   07/15/2012 9:00 AM [facility] [phone]   Future Orders Complete By Expires      (HEART FAILURE PATIENTS) Call MD:  Anytime you have any of the following symptoms: 1) 3 pound weight gain in 24 hours or 5 pounds in 1 week 2) shortness of breath, with or without a dry hacking cough 3) swelling in the hands, feet or stomach 4) if you have to sleep on extra pillows at night in order to breathe.  As  directed     Contraindication to ACEI at discharge  As directed     Comments:      Renal Failure    Diet - low sodium heart healthy  As directed     Heart Failure patients record your daily weight using the same scale at the same time of day  As directed     Increase activity slowly  As directed       Follow-up Information   Follow up with [name], MD On 07/15/2012. (at  0900 [address] 0100)    Contact information:   [address] [place] [address] [phone]         Duration of Discharge Encounter: Greater than 35 minutes   Signed, CLEGG,AMY  07/10/2012, 7:14 AM  Patient seen and examined with Tonye Becket, NP. We discussed all aspects of the encounter. I agree with the assessment and plan as stated above. I have edited note above to reflect my thoughts.  Daniel Bensimhon,MD 8:51 AM

## 2012-07-09 NOTE — Progress Notes (Signed)
CARDIAC REHAB PHASE I   PRE:  Rate/Rhythm: 75 SR    BP: sitting 102/80    SaO2: 97 RA  MODE:  Ambulation: 640 ft   POST:  Rate/Rhythm: 76 SR    BP: sitting 130/90     SaO2: 98 RA  tolearted well. BP higher after walk. No c/o. Asking for water, restrictions are difficult for him. Will f/u tomorrow for education when less visitors. [id number]   Elissa Lovett [place] CES, ACSM 07/09/2012 11:57 AM

## 2012-07-09 NOTE — Progress Notes (Signed)
Advanced Heart Failure Team Rounding Note   Reason for Consultation: Acute Systolic Heart Failure  HPI:    37 y/o admitted with acute HF. Found to have EF 15% with restrictive diastolic parameters and mild to moderate RV dysfunction.  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   Doing fine this am. Though he says he feels fatigued from meds.  Cath yesterday. Normal cors. +Volume overload. Cardiac output ok . Diuresed 3L overnight. Weight down 4 pounds.   RA = 13  RV = 62/11/145  PA = 61/36 (47)  PCW = 28  Fick cardiac output/index = 5.0/2.2  Thermo CO/CI 5.8/2.5  SVR = 1292  PVR = 3.3 Woods  FA sat = 96%  PA sat = 64%, 67%  Labs pending this am  Objective:    Vital Signs:   Temp:  [97.4 F (36.3 C)-97.9 F (36.6 C)] 97.6 F (36.4 C) (03/26 0431) Pulse Rate:  [68-87] 70 (03/26 0431) Resp:  [17-20] 18 (03/26 0431) BP: (116-136)/(69-97) 121/84 mmHg (03/26 0431) SpO2:  [95 %-100 %] 95 % (03/26 0431) Weight:  [99.474 kg (219 lb 4.8 oz)] 99.474 kg (219 lb 4.8 oz) (03/26 0431) Last BM Date: 07/08/12  Weight change: Filed Weights   07/07/12 0500 07/08/12 0439 07/09/12 0431  Weight: 100.1 kg (220 lb 10.9 oz) 101.152 kg (223 lb) 99.474 kg (219 lb 4.8 oz)    Intake/Output:   Intake/Output Summary (Last 24 hours) at 07/09/12 0814 Last data filed at 07/09/12 0600  Gross per 24 hour  Intake    723 ml  Output   3425 ml  Net  -2702 ml     Physical Exam: General:  Well appearing. No resp difficulty HEENT: normal Neck: supple. JVP 5-6. Carotids 2+ bilat; no bruits. No lymphadenopathy or thryomegaly appreciated. Cor: PMI nondisplaced. Regular rate & rhythm. No rubs, or murmurs. + S3 Lungs: clear Abdomen: soft, nontender, nondistended. No hepatosplenomegaly. No bruits or masses. Good bowel sounds. Extremities: no cyanosis, clubbing, rash, edema. Groin site ok Neuro: alert & orientedx3, cranial nerves grossly intact. moves all 4 extremities w/o difficulty. Affect pleasant  Telemetry: SR    Labs: Basic Metabolic Panel:  Recent Labs Lab 07/06/12 0749 07/06/12 1530 07/07/12 0615 07/08/12 0630 07/08/12 2021  NA 142  --  141 138  --   K 4.9  --  4.8 4.0  --   CL 105  --  104 102  --   CO2 27  --  30 28  --   GLUCOSE 99  --  94 92  --   BUN 15  --  15 17  --   CREATININE 1.51* 1.45* 1.49* 1.51* 1.46*  CALCIUM 9.4  --  9.2 9.1  --   MG  --   --  2.0  --   --     Liver Function Tests:  Recent Labs Lab 07/06/12 0749  AST 36  ALT 59*  ALKPHOS 50  BILITOT 1.1  PROT 6.4  ALBUMIN 3.6    Recent Labs Lab 07/06/12 0749  LIPASE 20   No results found for this basename: AMMONIA,  in the last 168 hours  CBC:  Recent Labs Lab 07/06/12 0749 07/06/12 1530 07/08/12 2021  WBC 5.8 6.9 5.7  NEUTROABS 3.7  --   --   HGB 14.5 14.5 13.6  HCT 42.2 41.8 38.9*  MCV 84.9 84.3 82.1  PLT 162 155 161    Cardiac Enzymes:  Recent Labs Lab 07/06/12 0750 07/06/12 0949 07/06/12 1404  CKTOTAL  --   --  124  CKMB  --   --  4.2*  TROPONINI 0.43* 0.39* 0.42*    BNP: BNP (last 3 results)  Recent Labs  07/06/12 0750  PROBNP 3526.0*    CBG: No results found for this basename: GLUCAP,  in the last 168 hours  Coagulation Studies:  Recent Labs  07/07/12 0615  LABPROT 14.6  INR 1.16    Other results: EKG:JR 107 bpm  Imaging: No results found.   Medications:     Current Medications: . aspirin EC  81 mg Oral Daily  . budesonide-formoterol  2 puff Inhalation BID  . carvedilol  3.125 mg Oral BID WC  . digoxin  0.125 mg Oral Daily  . furosemide  80 mg Intravenous BID  . heparin  5,000 Units Subcutaneous Q8H  . hydrALAZINE  25 mg Oral Q8H  . isosorbide mononitrate  15 mg Oral Daily  . montelukast  4 mg Oral QHS  . pantoprazole  20 mg Oral Q1200  . pneumococcal 23 valent vaccine  0.5 mL Intramuscular Tomorrow-1000  . sodium chloride  3 mL Intravenous Q12H    Infusions:     Assessment:   1. Acute Systolic Heart Failure- 07/07/12  EF 15% 2.  Acute Renal Failure 3. HTN    Plan/Discussion:     Cath results reviewed with him. Will continue to diurese and titrate meds at tolerated. To be fitted with LifeVest today.  Possibly home tomorrow or Friday. Will need very close f/u in HF clinic. Suspect he may need advanced therapies soon.   Agree with low-dose carvedilol and hydralazine/nitrates. Start low-dose spiro if K and renal function stable. No ACE-I now due to renal dysfunction.   Johnathon Olden,MD 8:14 AM

## 2012-07-09 NOTE — Progress Notes (Signed)
CSW met with patient and his wife today for supportive visit.  Patient's affect was quite flat and he kept his head bowed during the interview.  He stated that he is sick from some of his medications and they are adjusting his meds. Patient's demeanor is completely different from yesterday- he was unsmiling and not very talkative.  Patient related that he has spoken to Dr. Gala Romney following his heart cath but denied that he has an understanding of his ongoing medical needs/issues. CSW briefly discussed with patient and wife the need to check with patient's Human Resource Dept to look at possible short term and long term disability options;  Patient will also need to have an honest discussion with MD regarding future medical needs and abilities.  He will most likely need to look at disability through Social Security in the near future.  While this information was noted by patient and his wife, it is apparent that it will need to be reviewed with them at a future point. Patient denied any current needs or questions. CSW will follow up as needed.  Lorri Frederick. [place]  [phone]

## 2012-07-10 DIAGNOSIS — I5041 Acute combined systolic (congestive) and diastolic (congestive) heart failure: Secondary | ICD-10-CM

## 2012-07-10 DIAGNOSIS — I5023 Acute on chronic systolic (congestive) heart failure: Secondary | ICD-10-CM

## 2012-07-10 DIAGNOSIS — I472 Ventricular tachycardia: Secondary | ICD-10-CM

## 2012-07-10 DIAGNOSIS — I4729 Other ventricular tachycardia: Secondary | ICD-10-CM

## 2012-07-10 HISTORY — DX: Other ventricular tachycardia: I47.29

## 2012-07-10 HISTORY — DX: Ventricular tachycardia: I47.2

## 2012-07-10 LAB — BASIC METABOLIC PANEL
BUN: 19 mg/dL (ref 6–23)
CO2: 27 mEq/L (ref 19–32)
Chloride: 98 mEq/L (ref 96–112)
Creatinine, Ser: 1.55 mg/dL — ABNORMAL HIGH (ref 0.50–1.35)
Glucose, Bld: 76 mg/dL (ref 70–99)
Potassium: 4.2 mEq/L (ref 3.5–5.1)

## 2012-07-10 LAB — PRO B NATRIURETIC PEPTIDE: Pro B Natriuretic peptide (BNP): 1462 pg/mL — ABNORMAL HIGH (ref 0–125)

## 2012-07-10 MED ORDER — SPIRONOLACTONE 12.5 MG HALF TABLET: 12.5000 mg | ORAL_TABLET | Freq: Every day | ORAL | Status: DC

## 2012-07-10 MED ORDER — FUROSEMIDE 40 MG PO TABS: 40.0000 mg | ORAL_TABLET | Freq: Every day | ORAL | Status: DC

## 2012-07-10 MED ORDER — DIGOXIN 125 MCG PO TABS: 0.1250 mg | ORAL_TABLET | Freq: Every day | ORAL | Status: DC

## 2012-07-10 MED ORDER — CARVEDILOL 3.125 MG PO TABS: 3.1250 mg | ORAL_TABLET | Freq: Two times a day (BID) | ORAL | Status: DC

## 2012-07-10 MED ORDER — HYDRALAZINE HCL 25 MG PO TABS: 25.0000 mg | ORAL_TABLET | Freq: Three times a day (TID) | ORAL | Status: DC

## 2012-07-10 NOTE — Progress Notes (Signed)
Patient d/c'd home today with his wife.  Patient left the hospital before CSW could see him today. RNCM was able to check on him prior to d/c and stated that he did not have any identified needs at this time. He has a Technical sales engineer in place.  CSW signing off. Lorri Frederick. [place]  [phone]

## 2012-07-10 NOTE — Progress Notes (Signed)
P d/c'd to home with wife. Life vest applied before d/c. Pt comfortable with application

## 2012-07-15 ENCOUNTER — Ambulatory Visit (HOSPITAL_COMMUNITY)
Admit: 2012-07-15 | Discharge: 2012-07-15 | Disposition: A | Discharge status: Home / Self Care | Payer: PRIVATE HEALTH INSURANCE | Attending: Internal Medicine | Admitting: Internal Medicine

## 2012-07-15 ENCOUNTER — Encounter (HOSPITAL_COMMUNITY): Payer: Self-pay

## 2012-07-15 VITALS — BP 116/88 | HR 88 | Ht 75.0 in | Wt 221.8 lb

## 2012-07-15 DIAGNOSIS — R0601 Orthopnea: Secondary | ICD-10-CM | POA: Insufficient documentation

## 2012-07-15 DIAGNOSIS — I472 Ventricular tachycardia, unspecified: Secondary | ICD-10-CM | POA: Insufficient documentation

## 2012-07-15 DIAGNOSIS — I4729 Other ventricular tachycardia: Secondary | ICD-10-CM | POA: Insufficient documentation

## 2012-07-15 DIAGNOSIS — I5022 Chronic systolic (congestive) heart failure: Secondary | ICD-10-CM | POA: Insufficient documentation

## 2012-07-15 MED ORDER — LISINOPRIL 10 MG PO TABS: 5.0000 mg | ORAL_TABLET | Freq: Every day | ORAL | Status: DC

## 2012-07-15 NOTE — Progress Notes (Deleted)
Subjective:      Patient ID: Eric Daniel is a 37 y.o. male.  Chief Complaint: HPI {Common ambulatory SmartLinks:19316} ROS    Objective:    Physical Exam  Lab Review:  {Recent labs:19471::"not applicable"}    Assessment:     Shortness of breath - Plan: Pulmonary function test   Plan:     ***

## 2012-07-15 NOTE — Patient Instructions (Addendum)
Will call with lab results.  Start Lisinopril 5 mg (1/2 tablet) daily at night.  Follow up 2 weeks.  Do the following things EVERYDAY: 1) Weigh yourself in the morning before breakfast. Write it down and keep it in a log. 2) Take your medicines as prescribed 3) Eat low salt foods-Limit salt (sodium) to 2000 mg per day.  4) Stay as active as you can everyday 5) Limit all fluids for the day to less than 2 liters

## 2012-07-15 NOTE — Progress Notes (Signed)
Patient ID: ANTHON HARPOLE, male   DOB: 1975-09-06, 37 y.o.   MRN: [id number]  Weight Range  215-217  Baseline proBNP     HPI: Mr. Slusher is 37 y/o admitted with acute HF. Found to have EF 15% with restrictive diastolic parameters and mild to moderate RV dysfunction. 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 His father was just recently implanted with a HM II LVAD in February 2014.  He was admitted to [facility] 07/06/12 with increased dyspnea on exertion. Pertinent admission labs included Pro BNP 3526, Troponin 0.43>0.39>0.42, creatinine 1.55, Hemoglobin 14.5, TSH 1.46, and potassium 4.9. UDS negative. He is also O+. Discharge weight 216 lbs  RA = 13  RV = 62/11/145  PA = 61/36 (47)  PCW = 28  Fick cardiac output/index = 5.0/2.2  Thermo CO/CI 5.8/2.5  SVR = 1292  PVR = 3.3 Woods  FA sat = 96%  PA sat = 64%, 67%  Follow up:  Discharged 3/27 from [facility]. Reports feeling good. Denies any SOB, orthopnea, CP or edema. Weight stable at home 215-217 lbs. Taking medications as prescribed. Denies dizziness. Wearing LifeVest.   ROS: All systems negative except as listed in HPI, PMH and Problem List.  No past medical history on file.  Current Outpatient Prescriptions  Medication Sig Dispense Refill  . albuterol (PROVENTIL HFA;VENTOLIN HFA) 108 (90 BASE) MCG/ACT inhaler Inhale 2 puffs into the lungs every 6 (six) hours as needed for wheezing.      . carvedilol (COREG) 3.125 MG tablet Take 1 tablet (3.125 mg total) by mouth 2 (two) times daily with a meal.  60 tablet  3  . digoxin (LANOXIN) 0.125 MG tablet Take 1 tablet (0.125 mg total) by mouth daily.  30 tablet  3  . furosemide (LASIX) 40 MG tablet Take 1 tablet (40 mg total) by mouth daily.  30 tablet  3  . hydrALAZINE (APRESOLINE) 25 MG tablet Take 1 tablet (25 mg total) by mouth every 8 (eight) hours.  90 tablet  3  . montelukast (SINGULAIR) 4 MG chewable tablet Chew 4 mg by mouth at bedtime.      . pantoprazole (PROTONIX) 20 MG tablet Take 20 mg by mouth daily.       Marland Kitchen spironolactone (ALDACTONE) 12.5 mg TABS Take 0.5 tablets (12.5 mg total) by mouth daily.  30 tablet  3   No current facility-administered medications for this encounter.     PHYSICAL EXAM: Filed Vitals:   07/15/12 0917  BP: 116/88  Pulse: 88  Height: 6\' 3"  (1.905 m)  Weight: 221 lb 12.8 oz (100.608 kg)  SpO2: 100%    General:  Well appearing. No resp difficulty HEENT: normal Neck: supple. JVP 6-7. Carotids 2+ bilaterally; no bruits. No lymphadenopathy or thryomegaly appreciated. Cor: PMI normal. Regular rate & rhythm. No rubs, gallops or murmurs; +S3 Lungs: clear Abdomen: soft, nontender, nondistended. No hepatosplenomegaly. No bruits or masses. Good bowel sounds. Extremities: no cyanosis, clubbing, rash, edema Neuro: alert & orientedx3, cranial nerves grossly intact. Moves all 4 extremities w/o difficulty. Affect pleasant.   ASSESSMENT & PLAN:

## 2012-07-15 NOTE — Assessment & Plan Note (Signed)
NYHA II symptoms. Volume status good. Patient denies any SOB, orthopnea, or edema. Taking medications as prescribed and weighing daily. Continue to wear LifeVest. Patient enrolled in Guide-It. Will get BMET today, and start on low dose ACE-I 5 mg lisinopril. Follow up 2 weeks.

## 2012-07-15 NOTE — Assessment & Plan Note (Signed)
Denies any dizziness or beeps on LifeVest. Continue to wear LifeVest daily,

## 2012-07-15 NOTE — Assessment & Plan Note (Signed)
Denies any SOB or orthopnea. Will continue to titrate HF medications.

## 2012-07-15 NOTE — Research (Signed)
Guide-IT Informed Consent   Subject Name: Eric Daniel  Subject met inclusion and exclusion criteria.  The informed consent form, study requirements and expectations were reviewed with the subject and questions and concerns were addressed prior to the signing of the consent form.  The subject verbalized understanding of the trial requirements.  The subject agreed to participate in the Guide-IT trial and signed the informed consent.  The informed consent was obtained prior to performance of any protocol-specific procedures for the subject.  A copy of the signed informed consent was given to the subject and a copy was placed in the subject's medical record.  Almedia Balls 07/15/2012, 11:42 AM

## 2012-07-17 ENCOUNTER — Telehealth (HOSPITAL_COMMUNITY): Payer: Self-pay | Admitting: *Deleted

## 2012-07-17 DIAGNOSIS — I5022 Chronic systolic (congestive) heart failure: Secondary | ICD-10-CM

## 2012-07-17 NOTE — Telephone Encounter (Signed)
Received pt's labs bun 20 cr 1.61, per Tonye Becket, NP recheck in 1 week pt is aware and agreeable, he will go for labs 4/7 to [facility]

## 2012-07-18 ENCOUNTER — Encounter (HOSPITAL_COMMUNITY): Payer: Self-pay | Admitting: Anesthesiology

## 2012-07-21 ENCOUNTER — Other Ambulatory Visit (INDEPENDENT_AMBULATORY_CARE_PROVIDER_SITE_OTHER): Payer: PRIVATE HEALTH INSURANCE

## 2012-07-21 DIAGNOSIS — I5022 Chronic systolic (congestive) heart failure: Secondary | ICD-10-CM

## 2012-07-21 LAB — BASIC METABOLIC PANEL
BUN: 15 mg/dL (ref 6–23)
CO2: 35 mEq/L — ABNORMAL HIGH (ref 19–32)
Calcium: 9.6 mg/dL (ref 8.4–10.5)
Chloride: 101 mEq/L (ref 96–112)
Creatinine, Ser: 1.4 mg/dL (ref 0.4–1.5)
Glucose, Bld: 64 mg/dL — ABNORMAL LOW (ref 70–99)

## 2012-07-30 ENCOUNTER — Ambulatory Visit (HOSPITAL_COMMUNITY)
Admission: RE | Admit: 2012-07-30 | Discharge: 2012-07-30 | Disposition: A | Discharge status: Home / Self Care | Payer: PRIVATE HEALTH INSURANCE | Source: Ambulatory Visit | Attending: Internal Medicine | Admitting: Internal Medicine

## 2012-07-30 ENCOUNTER — Encounter: Payer: Self-pay | Admitting: Internal Medicine

## 2012-07-30 VITALS — BP 112/76 | HR 65 | Wt 227.0 lb

## 2012-07-30 DIAGNOSIS — I509 Heart failure, unspecified: Secondary | ICD-10-CM | POA: Insufficient documentation

## 2012-07-30 DIAGNOSIS — Z79899 Other long term (current) drug therapy: Secondary | ICD-10-CM | POA: Insufficient documentation

## 2012-07-30 DIAGNOSIS — I5022 Chronic systolic (congestive) heart failure: Secondary | ICD-10-CM

## 2012-07-30 MED ORDER — CARVEDILOL 3.125 MG PO TABS: 6.2500 mg | ORAL_TABLET | Freq: Two times a day (BID) | ORAL | Status: DC

## 2012-07-30 NOTE — Patient Instructions (Addendum)
Increase carvedilol to 1 tab in the morning and 2 tabs at night.  On Saturday increase carvedilol 2 tabs twice day.    Follow up 3 weeks.

## 2012-07-30 NOTE — Progress Notes (Signed)
   Weight Range  215-217  Baseline proBNP     HPI: Mr. Dethloff is 37 y/o admitted with acute HF. Found to have EF 15% with restrictive diastolic parameters and mild to moderate RV dysfunction. 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 His father was just recently implanted with a HM II LVAD in February 2014.  He was admitted to [facility] 07/06/12 with increased dyspnea on exertion. Pertinent admission labs included Pro BNP 3526, Troponin 0.43>0.39>0.42, creatinine 1.55, Hemoglobin 14.5, TSH 1.46, and potassium 4.9. UDS negative. He is also O+. Discharge weight 216 lbs  RA = 13  RV = 62/11/145  PA = 61/36 (47)  PCW = 28  Fick cardiac output/index = 5.0/2.2  Thermo CO/CI 5.8/2.5  SVR = 1292  PVR = 3.3 Woods  FA sat = 96%  PA sat = 64%, 67%  He returns for follow up today.  Last visit lisinopril 5 mg started.  He feels fantastic.  Rode bike ~1 mile yesterday.  Landscaped yard this weekend.  He denies dyspnea, orthopnea or PND.  No dizziness/syncope.  Weight is 216-219 pounds.   Lifevest without alarms.  Has evaluation with [facility] on Friday.    Can't tolerate imdur due to HA.   ROS: All systems negative except as listed in HPI, PMH and Problem List.  No past medical history on file.  Current Outpatient Prescriptions  Medication Sig Dispense Refill  . albuterol (PROVENTIL HFA;VENTOLIN HFA) 108 (90 BASE) MCG/ACT inhaler Inhale 2 puffs into the lungs every 6 (six) hours as needed for wheezing.      . carvedilol (COREG) 3.125 MG tablet Take 1 tablet (3.125 mg total) by mouth 2 (two) times daily with a meal.  60 tablet  3  . digoxin (LANOXIN) 0.125 MG tablet Take 1 tablet (0.125 mg total) by mouth daily.  30 tablet  3  . furosemide (LASIX) 40 MG tablet Take 1 tablet (40 mg total) by mouth daily.  30 tablet  3  . hydrALAZINE (APRESOLINE) 25 MG tablet Take 1 tablet (25 mg total) by mouth every 8 (eight) hours.  90 tablet  3  . lisinopril (PRINIVIL) 10 MG tablet Take 0.5 tablets (5 mg total) by mouth daily.  30  tablet  3  . montelukast (SINGULAIR) 4 MG chewable tablet Chew 4 mg by mouth at bedtime.      . pantoprazole (PROTONIX) 20 MG tablet Take 20 mg by mouth daily.      Marland Kitchen spironolactone (ALDACTONE) 12.5 mg TABS Take 0.5 tablets (12.5 mg total) by mouth daily.  30 tablet  3   No current facility-administered medications for this encounter.     PHYSICAL EXAM: Filed Vitals:   07/30/12 1114  BP: 112/76  Pulse: 65  Weight: 227 lb (102.967 kg)  SpO2: 97%  *weighed with LifeVest on*  General:  Well appearing. No resp difficulty HEENT: normal Neck: supple. JVP 6-7. Carotids 2+ bilaterally; no bruits. No lymphadenopathy or thryomegaly appreciated. Cor: PMI normal. Regular rate & rhythm. No rubs, gallops or murmurs; +S3 Lungs: clear Abdomen: soft, nontender, nondistended. No hepatosplenomegaly. No bruits or masses. Good bowel sounds. Extremities: no cyanosis, clubbing, rash, edema Neuro: alert & orientedx3, cranial nerves grossly intact. Moves all 4 extremities w/o difficulty. Affect pleasant.   ASSESSMENT & PLAN:

## 2012-07-30 NOTE — Assessment & Plan Note (Addendum)
NYHA II.  Volume status well controlled on current regimen.  Will continue lasix.  Increase carvedilol 6.25 mg in stepwise fashion over the next 3 days.  He will call if he is unable to tolerate increase.  Continue Lifevest for 2 more months then will repeat echo.  If EF remains <35% will need ICD implantation.  Will get BMET/proBNP today with Guide-it Trial.  Will await transplant evaluation at [facility] on Friday. Follow up 2-3 weeks.

## 2012-07-30 NOTE — Addendum Note (Signed)
Encounter addended by: Hadassah Pais, PA-C on: 07/30/2012  4:26 PM<BR>     Documentation filed: Visit Diagnoses, Problem List

## 2012-08-08 ENCOUNTER — Other Ambulatory Visit (HOSPITAL_COMMUNITY): Payer: Self-pay | Admitting: *Deleted

## 2012-08-08 MED ORDER — CARVEDILOL 6.25 MG PO TABS: 6.2500 mg | ORAL_TABLET | Freq: Two times a day (BID) | ORAL | Status: DC

## 2012-08-12 ENCOUNTER — Encounter: Payer: Self-pay | Admitting: Cardiology

## 2012-08-19 ENCOUNTER — Ambulatory Visit (HOSPITAL_COMMUNITY)
Admission: RE | Admit: 2012-08-19 | Discharge: 2012-08-19 | Disposition: A | Discharge status: Home / Self Care | Payer: PRIVATE HEALTH INSURANCE | Source: Ambulatory Visit | Attending: Internal Medicine | Admitting: Internal Medicine

## 2012-08-19 ENCOUNTER — Encounter (HOSPITAL_COMMUNITY): Payer: Self-pay

## 2012-08-19 VITALS — BP 124/92 | HR 76 | Wt 231.4 lb

## 2012-08-19 DIAGNOSIS — I5022 Chronic systolic (congestive) heart failure: Secondary | ICD-10-CM | POA: Insufficient documentation

## 2012-08-19 MED ORDER — CARVEDILOL 6.25 MG PO TABS: 9.3750 mg | ORAL_TABLET | Freq: Two times a day (BID) | ORAL | Status: DC

## 2012-08-19 MED ORDER — LISINOPRIL 10 MG PO TABS: 10.0000 mg | ORAL_TABLET | Freq: Every day | ORAL | Status: DC

## 2012-08-19 NOTE — Patient Instructions (Addendum)
Increase lisinopril to 10 mg daily.  Increase Coreg to 9.375 mg (1 1/2 tabs) twice a day.  Doing great.  Will follow up in 2 weeks with Dr. Gala Romney and discuss repeat ECHO.

## 2012-08-19 NOTE — Assessment & Plan Note (Addendum)
NYHA II. Volume status good. Patient seen at [facility] for transplant evaluation and had CPX with results listed in note. Patient feeling great. BP 124/92 will increase carvedilol to 9.375 mg BID and increase lisinopril to 10 mg daily. Patient instructed to call if he is unable to tolerate increase. Will continue LifeVest at this time. Will get BMET today to assess Cr. With Guide-It trial. Follow up 2 weeks with Dr. Gala Romney. Consider getting ECHO at 2 months to reassess EF. If EF remains <35% will need ICD.   Patient seen and examined with Ulla Potash, NP. We discussed all aspects of the encounter. I agree with the assessment and plan as stated above.Reviewed CPX test with him. Results are remarkable for his degree of LV dysfunction. Continue to titrated meds and follow closely.

## 2012-08-19 NOTE — Progress Notes (Signed)
Patient ID: Eric Daniel, male   DOB: 11-22-1975, 37 y.o.   MRN: [id number]   Weight Range  215-217  Baseline proBNP     HPI: Eric Daniel is 37 y/o admitted with acute HF. Found to have EF 15% with restrictive diastolic parameters and mild to moderate RV dysfunction. 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 His father was just recently implanted with a HM II LVAD in February 2014.  He was admitted to [facility] 07/06/12 with increased dyspnea on exertion. Pertinent admission labs included Pro BNP 3526, Troponin 0.43>0.39>0.42, creatinine 1.55, Hemoglobin 14.5, TSH 1.46, and potassium 4.9. UDS negative. He is also O+. Discharge weight 216 lbs  RA = 13  RV = 62/11/145  PA = 61/36 (47)  PCW = 28  Fick cardiac output/index = 5.0/2.2  Thermo CO/CI 5.8/2.5  SVR = 1292  PVR = 3.3 Woods  FA sat = 96%  PA sat = 64%, 67%  [facility] CPX 08/01/12 Peak VO2: 37.10 VE/VCO2 slope: 22.40 Peak RER: 1.17  Follow up: Last visit increased carvedilol 6.25 mg. Patient denies fatigue, SOB, or CP. Reports feeling great. Riding stationary bicycle about 1 mile a day. Wearing LifeVest. Weight at home 218-221 lbs.  + occasional dizziness with bending over. Follow up with [facility] 3 months.   ROS: All systems negative except as listed in HPI, PMH and Problem List.  No past medical history on file.  Current Outpatient Prescriptions  Medication Sig Dispense Refill  . albuterol (PROVENTIL HFA;VENTOLIN HFA) 108 (90 BASE) MCG/ACT inhaler Inhale 2 puffs into the lungs every 6 (six) hours as needed for wheezing.      . carvedilol (COREG) 6.25 MG tablet Take 1 tablet (6.25 mg total) by mouth 2 (two) times daily with a meal.  60 tablet  3  . digoxin (LANOXIN) 0.125 MG tablet Take 1 tablet (0.125 mg total) by mouth daily.  30 tablet  3  . furosemide (LASIX) 40 MG tablet Take 1 tablet (40 mg total) by mouth daily.  30 tablet  3  . hydrALAZINE (APRESOLINE) 25 MG tablet Take 1 tablet (25 mg total) by mouth every 8 (eight) hours.  90 tablet  3  . lisinopril  (PRINIVIL) 10 MG tablet Take 0.5 tablets (5 mg total) by mouth daily.  30 tablet  3  . montelukast (SINGULAIR) 4 MG chewable tablet Chew 4 mg by mouth at bedtime.      . pantoprazole (PROTONIX) 20 MG tablet Take 20 mg by mouth daily.      Marland Kitchen spironolactone (ALDACTONE) 12.5 mg TABS Take 0.5 tablets (12.5 mg total) by mouth daily.  30 tablet  3   No current facility-administered medications for this encounter.     PHYSICAL EXAM: Filed Vitals:   08/19/12 1012  BP: 124/92  Pulse: 76  Weight: 231 lb 6.4 oz (104.962 kg)  SpO2: 100%  *weighed with LifeVest on*  General:  Well appearing. No resp difficulty HEENT: normal Neck: supple. JVP flat. Carotids 2+ bilaterally; no bruits. No lymphadenopathy or thryomegaly appreciated. Cor: PMI normal. Regular rate & rhythm. No rubs, gallops or murmurs; +S3 Lungs: clear Abdomen: soft, nontender, nondistended. No hepatosplenomegaly. No bruits or masses. Good bowel sounds. Extremities: no cyanosis, clubbing, rash, edema Neuro: alert & orientedx3, cranial nerves grossly intact. Moves all 4 extremities w/o difficulty. Affect pleasant.   ASSESSMENT & PLAN:

## 2012-09-02 ENCOUNTER — Encounter: Payer: Self-pay | Admitting: Internal Medicine

## 2012-09-04 ENCOUNTER — Ambulatory Visit (HOSPITAL_COMMUNITY)
Admission: RE | Admit: 2012-09-04 | Discharge: 2012-09-04 | Disposition: A | Discharge status: Home / Self Care | Payer: PRIVATE HEALTH INSURANCE | Source: Ambulatory Visit | Attending: Internal Medicine | Admitting: Internal Medicine

## 2012-09-04 ENCOUNTER — Encounter (HOSPITAL_COMMUNITY): Payer: Self-pay

## 2012-09-04 VITALS — BP 112/80 | HR 73 | Wt 231.0 lb

## 2012-09-04 DIAGNOSIS — I5022 Chronic systolic (congestive) heart failure: Secondary | ICD-10-CM | POA: Insufficient documentation

## 2012-09-04 MED ORDER — CARVEDILOL 12.5 MG PO TABS: 12.5000 mg | ORAL_TABLET | Freq: Two times a day (BID) | ORAL | Status: DC

## 2012-09-04 NOTE — Patient Instructions (Addendum)
Increase carvedilol to 12.5 mg twice a day.   Call any issues.  Your physician has requested that you have an echocardiogram. Echocardiography is a painless test that uses sound waves to create images of your heart. It provides your doctor with information about the size and shape of your heart and how well your heart's chambers and valves are working. This procedure takes approximately one hour. There are no restrictions for this procedure.  Wednesday June 4TH AT 11 AM, PARKING CODE 0100  Your physician recommends that you schedule a follow-up appointment in: Thursday June 26TH AT 11:20 AM, PARKING CODE 0100

## 2012-09-04 NOTE — Assessment & Plan Note (Addendum)
NYHA II. Patient continues to do great. Appears to be euvolemic. Denies any SOB, orthopnea or CP. Wearing LifeVest 24 hrs/day. Increase carvedilol to 12.5 mg BID. Instructed patient to call if can not tolerate increase. Labs today with Guide-It. Will get ECHO 2 weeks to assess LV dysfunction and if remains < 35% need to consider ICD.

## 2012-09-04 NOTE — Progress Notes (Signed)
Patient ID: Eric Daniel, male   DOB: 08/05/1975, 37 y.o.   MRN: [id number]  Weight Range  215-217  Baseline proBNP    HPI:  Eric Daniel is 37 y/o admitted with acute HF. Found to have EF 15% with restrictive diastolic parameters and mild to moderate RV dysfunction. 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 His father was just recently implanted with a HM II LVAD in February 2014.   He was admitted to [facility] 07/06/12 with increased dyspnea on exertion. Pertinent admission labs included Pro BNP 3526, Troponin 0.43>0.39>0.42, creatinine 1.55, Hemoglobin 14.5, TSH 1.46, and potassium 4.9. UDS negative. He is also O+. Discharge weight 216 lbs   RA = 13  RV = 62/11/145  PA = 61/36 (47)  PCW = 28  Fick cardiac output/index = 5.0/2.2  Thermo CO/CI 5.8/2.5  SVR = 1292  PVR = 3.3 Woods  FA sat = 96%  PA sat = 64%, 67%   [facility] CPX 08/01/12  Peak VO2: 37.10  VE/VCO2 slope: 22.40  Peak RER: 1.17  Follow up: Last visit carvedilol increased to 9.375 mg BID and lisinopril to 10 mg daily. Doing well and has tolerated increase. Denies SOB, CP, or orthopnea. Weight stable 228 lbs. Not on Imdur because of headaches. Still wearing LifeVest.    ROS: All systems negative except as listed in HPI, PMH and Problem List.  No past medical history on file.  Current Outpatient Prescriptions  Medication Sig Dispense Refill  . carvedilol (COREG) 6.25 MG tablet Take 1.5 tablets (9.375 mg total) by mouth 2 (two) times daily with a meal.  60 tablet  3  . digoxin (LANOXIN) 0.125 MG tablet Take 1 tablet (0.125 mg total) by mouth daily.  30 tablet  3  . furosemide (LASIX) 40 MG tablet Take 1 tablet (40 mg total) by mouth daily.  30 tablet  3  . hydrALAZINE (APRESOLINE) 25 MG tablet Take 1 tablet (25 mg total) by mouth every 8 (eight) hours.  90 tablet  3  . lisinopril (PRINIVIL) 10 MG tablet Take 1 tablet (10 mg total) by mouth daily.  30 tablet  3  . montelukast (SINGULAIR) 4 MG chewable tablet Chew 4 mg by mouth at bedtime.      .  pantoprazole (PROTONIX) 20 MG tablet Take 20 mg by mouth daily.      Marland Kitchen spironolactone (ALDACTONE) 12.5 mg TABS Take 0.5 tablets (12.5 mg total) by mouth daily.  30 tablet  3   No current facility-administered medications for this encounter.     PHYSICAL EXAM: Filed Vitals:   09/04/12 1039  BP: 112/80  Pulse: 73  Weight: 231 lb (104.781 kg)  SpO2: 100%    General:  Well appearing. No resp difficulty HEENT: normal Neck: supple. JVP flat. Carotids 2+ bilaterally; no bruits. No lymphadenopathy or thryomegaly appreciated. Cor: PMI normal. Regular rate & rhythm. No rubs, gallops or murmurs. Lungs: clear Abdomen: soft, nontender, nondistended. No hepatosplenomegaly. No bruits or masses. Good bowel sounds. Extremities: no cyanosis, clubbing, rash, edema Neuro: alert & orientedx3, cranial nerves grossly intact. Moves all 4 extremities w/o difficulty. Affect pleasant.     ASSESSMENT & PLAN:

## 2012-09-05 ENCOUNTER — Other Ambulatory Visit (HOSPITAL_COMMUNITY): Payer: Self-pay | Admitting: *Deleted

## 2012-09-05 MED ORDER — LISINOPRIL 10 MG PO TABS: 10.0000 mg | ORAL_TABLET | Freq: Every day | ORAL | Status: DC

## 2012-09-17 ENCOUNTER — Ambulatory Visit (HOSPITAL_COMMUNITY)
Admission: RE | Admit: 2012-09-17 | Discharge: 2012-09-17 | Disposition: A | Discharge status: Home / Self Care | Payer: PRIVATE HEALTH INSURANCE | Source: Ambulatory Visit | Attending: Internal Medicine | Admitting: Internal Medicine

## 2012-09-17 DIAGNOSIS — I059 Rheumatic mitral valve disease, unspecified: Secondary | ICD-10-CM

## 2012-09-17 DIAGNOSIS — I5022 Chronic systolic (congestive) heart failure: Secondary | ICD-10-CM

## 2012-09-17 DIAGNOSIS — I509 Heart failure, unspecified: Secondary | ICD-10-CM | POA: Insufficient documentation

## 2012-09-17 MED ORDER — PERFLUTREN LIPID MICROSPHERE
1.0000 mL | INTRAVENOUS | Status: AC | PRN
  Administered 2012-09-17: 3 mL via INTRAVENOUS
  Filled 2012-09-17: qty 10

## 2012-09-17 NOTE — CV Procedure (Signed)
20g Iv catheter inserted pt's left forearm. 3cc Definity injeced for study. Tolerated well with no adverse reactions. IV Dc'd post procedure.

## 2012-09-17 NOTE — Progress Notes (Addendum)
  Echocardiogram 2D Echocardiogram with Definity has been performed.  Lucero Auzenne 09/17/2012, 12:25 PM

## 2012-09-20 ENCOUNTER — Encounter (HOSPITAL_COMMUNITY): Payer: Self-pay | Admitting: Emergency Medicine

## 2012-09-20 ENCOUNTER — Emergency Department (HOSPITAL_COMMUNITY)
Admission: EM | Admit: 2012-09-20 | Discharge: 2012-09-20 | Disposition: A | Discharge status: Home / Self Care | Payer: PRIVATE HEALTH INSURANCE | Attending: Emergency Medicine | Admitting: Emergency Medicine

## 2012-09-20 ENCOUNTER — Emergency Department (HOSPITAL_COMMUNITY): Payer: PRIVATE HEALTH INSURANCE

## 2012-09-20 DIAGNOSIS — S0990XA Unspecified injury of head, initial encounter: Secondary | ICD-10-CM

## 2012-09-20 DIAGNOSIS — S01512A Laceration without foreign body of oral cavity, initial encounter: Secondary | ICD-10-CM

## 2012-09-20 DIAGNOSIS — I509 Heart failure, unspecified: Secondary | ICD-10-CM | POA: Insufficient documentation

## 2012-09-20 DIAGNOSIS — IMO0002 Reserved for concepts with insufficient information to code with codable children: Secondary | ICD-10-CM

## 2012-09-20 DIAGNOSIS — S0181XA Laceration without foreign body of other part of head, initial encounter: Secondary | ICD-10-CM

## 2012-09-20 DIAGNOSIS — S0180XA Unspecified open wound of other part of head, initial encounter: Secondary | ICD-10-CM | POA: Insufficient documentation

## 2012-09-20 DIAGNOSIS — Z23 Encounter for immunization: Secondary | ICD-10-CM | POA: Insufficient documentation

## 2012-09-20 DIAGNOSIS — Z79899 Other long term (current) drug therapy: Secondary | ICD-10-CM | POA: Insufficient documentation

## 2012-09-20 DIAGNOSIS — S01501A Unspecified open wound of lip, initial encounter: Secondary | ICD-10-CM | POA: Insufficient documentation

## 2012-09-20 HISTORY — DX: Heart failure, unspecified: I50.9

## 2012-09-20 MED ORDER — TETANUS-DIPHTH-ACELL PERTUSSIS 5-2.5-18.5 LF-MCG/0.5 IM SUSP
0.5000 mL | Freq: Once | INTRAMUSCULAR | Status: AC
  Administered 2012-09-20: 0.5 mL via INTRAMUSCULAR
  Filled 2012-09-20: qty 0.5

## 2012-09-20 NOTE — ED Notes (Signed)
Pt returned from CT °

## 2012-09-20 NOTE — ED Notes (Signed)
MD at bedside to suture pt.
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
## 2012-09-20 NOTE — ED Notes (Signed)
Pt denies n/v. Pt denies dizziness /lightheadedness.
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
## 2012-09-24 ENCOUNTER — Encounter: Payer: Self-pay | Admitting: Internal Medicine

## 2012-10-06 ENCOUNTER — Encounter: Payer: Self-pay | Admitting: Internal Medicine

## 2012-10-09 ENCOUNTER — Ambulatory Visit (HOSPITAL_COMMUNITY)
Admission: RE | Admit: 2012-10-09 | Discharge: 2012-10-09 | Disposition: A | Discharge status: Home / Self Care | Payer: PRIVATE HEALTH INSURANCE | Source: Ambulatory Visit | Attending: Internal Medicine | Admitting: Internal Medicine

## 2012-10-09 ENCOUNTER — Encounter (HOSPITAL_COMMUNITY): Payer: Self-pay

## 2012-10-09 ENCOUNTER — Encounter (HOSPITAL_COMMUNITY): Payer: Self-pay | Admitting: Anesthesiology

## 2012-10-09 VITALS — BP 102/70 | HR 72 | Wt 232.0 lb

## 2012-10-09 DIAGNOSIS — I5022 Chronic systolic (congestive) heart failure: Secondary | ICD-10-CM

## 2012-10-09 DIAGNOSIS — I509 Heart failure, unspecified: Secondary | ICD-10-CM | POA: Insufficient documentation

## 2012-10-09 DIAGNOSIS — N289 Disorder of kidney and ureter, unspecified: Secondary | ICD-10-CM

## 2012-10-09 MED ORDER — LISINOPRIL 10 MG PO TABS: 10.0000 mg | ORAL_TABLET | Freq: Two times a day (BID) | ORAL | Status: DC

## 2012-10-09 NOTE — Assessment & Plan Note (Addendum)
NYHA II symptoms. Volume status good. Patient's last ECHO reviewed and EF improved minimally to 20-25%. He has been on treatment for 3 months and will refer him to see EP for ICD implantation for EF <35%. Long discussion about indications for ICD and answered patients questions. Continue to wear LifeVest at this time. Will increase Lisinopril to 10 mg BID and discussed if he notices any increased fatigue to cut back to 5 mg at night. Labs today with Guide-It. Follow up 2 weeks.   Reinforced need for continued daily weights and sliding scale diuretics.
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
## 2012-10-09 NOTE — Patient Instructions (Addendum)
Increase lisinopril to 10 mg twice a day. If notice that you have any increased dizziness or drowsiness cut Lisinopril back to 10 mg in the morning and 5 mg at night.  Follow up 2 weeks with BMET  Follow up month in clinic.  Refer to EP for ICD.

## 2012-10-09 NOTE — Assessment & Plan Note (Signed)
Remains stable. BMET checked today.

## 2012-10-09 NOTE — Addendum Note (Signed)
Encounter addended by: Aundria Rud, NP on: 10/09/2012  5:34 PM<BR>     Documentation filed: Demographics Visit

## 2012-10-10 ENCOUNTER — Telehealth (HOSPITAL_COMMUNITY): Payer: Self-pay | Admitting: Adult Health

## 2012-10-10 MED ORDER — FUROSEMIDE 40 MG PO TABS: 40.0000 mg | ORAL_TABLET | ORAL | Status: DC | PRN

## 2012-10-10 NOTE — Telephone Encounter (Signed)
  Provided with lab results. Creatinine up from 1.2 to 1.4.  Weight at home stable. 225-228 pounds.   Instructed to only take lasix if weight is 232 pounds or greater. Repeat BMET in 2 weeks  Mr Eric Daniel verbalized understanding.   [name] 12:28 PM

## 2012-10-20 ENCOUNTER — Encounter (HOSPITAL_COMMUNITY): Payer: PRIVATE HEALTH INSURANCE

## 2012-10-21 ENCOUNTER — Encounter: Payer: Self-pay | Admitting: *Deleted

## 2012-10-22 ENCOUNTER — Ambulatory Visit (INDEPENDENT_AMBULATORY_CARE_PROVIDER_SITE_OTHER): Payer: PRIVATE HEALTH INSURANCE | Admitting: Internal Medicine

## 2012-10-22 ENCOUNTER — Encounter: Payer: Self-pay | Admitting: Internal Medicine

## 2012-10-22 VITALS — BP 128/86 | HR 60 | Ht 75.0 in | Wt 236.0 lb

## 2012-10-22 DIAGNOSIS — I428 Other cardiomyopathies: Secondary | ICD-10-CM | POA: Insufficient documentation

## 2012-10-22 DIAGNOSIS — I5041 Acute combined systolic (congestive) and diastolic (congestive) heart failure: Secondary | ICD-10-CM

## 2012-10-22 DIAGNOSIS — R9431 Abnormal electrocardiogram [ECG] [EKG]: Secondary | ICD-10-CM

## 2012-10-22 DIAGNOSIS — I5022 Chronic systolic (congestive) heart failure: Secondary | ICD-10-CM

## 2012-10-22 NOTE — Assessment & Plan Note (Signed)
He is euvolemic on guideline directed medical therapy.  We'll set lengthy discussion regarding the impact on his psyche of a diagnosis of chronic heart disease. He and his wife are struggling as as well understandable

## 2012-10-22 NOTE — Patient Instructions (Signed)
Your physician has recommended that you have a cardiopulmonary stress test (CPX). CPX testing is a non-invasive measurement of heart and lung function. It replaces a traditional treadmill stress test. This type of test provides a tremendous amount of information that relates not only to your present condition but also for future outcomes. This test combines measurements of you ventilation, respiratory gas exchange in the lungs, electrocardiogram (EKG), blood pressure and physical response before, during, and following an exercise protocol.   

## 2012-10-22 NOTE — Progress Notes (Signed)
Eric Daniel   ELECTROPHYSIOLOGY CONSULT NOTE  Patient ID: Eric Daniel, MRN: [id number], DOB/AGE: 01-08-1976 37 y.o. Admit date: (Not on file) Date of Consult: 10/22/2012  Primary Physician: Default, Provider, MD Primary Cardiologist: DB  Chief Complaint:  Question ICD v   HPI Eric Daniel is a 37 y.o. male  Referred from the heart failure clinic for consideration of ICD implantation. He is likely part of the family stricken with genetic cardiomyopathy.   he was admitted 3/14 with a two-month history of dyspnea on exertion. He was found to have an ejection fraction of 15%. He had borderline positive troponins and a mildly elevated creatinine. He underwent catheterization demonstrated no obstructive disease elevated left and right sided filling pressures>>.RA = 13  RV = 62/11/145  PA = 61/36 (47)  PCW = 28  Fick cardiac output/index = 5.0/2.2  Thermo CO/CI 5.8/2.5  SVR = 1292  PVR = 3.3 Woods  FA sat = 96%  PA sat = 64%, 67%  Ao Pressure: 120/94 (107)  LV Pressure: 132/27/38    He has been treated with guidelines directed medical therapy  And echocardiogram 6/14 demonstrated persistent LV dysfunction with an ejection fraction 20-25%  He is not working and this is been a major psychological hurdle. He loves to coach basketball.  We reviewed his family history. He has one brother that shares lineage with his father who has cardiomyopathy; he has one brother that shares lineage with his mother. They have not been evaluated.   Past Medical History  Diagnosis Date  . CHF (congestive heart failure)       Surgical History: No past surgical history on file.   Home Meds: Prior to Admission medications   Medication Sig Start Date End Date Taking? Authorizing Provider  carvedilol (COREG) 12.5 MG tablet Take 12.5 mg by mouth 2 (two) times daily with a meal. 09/04/12  Yes Aundria Rud, NP  digoxin (LANOXIN) 0.125 MG tablet Take 0.125 mg by mouth daily. 07/10/12  Yes Amy D Clegg, NP    furosemide (LASIX) 40 MG tablet Take 1 tablet (40 mg total) by mouth as needed. Only take lasix if weight is 232 pounds or greater. 10/10/12  Yes Amy D Clegg, NP  hydrALAZINE (APRESOLINE) 25 MG tablet Take 25 mg by mouth every 8 (eight) hours. 07/10/12  Yes Amy D Clegg, NP  lisinopril (PRINIVIL,ZESTRIL) 10 MG tablet Take 1 tablet (10 mg total) by mouth 2 (two) times daily. 10/09/12  Yes Aundria Rud, NP  montelukast (SINGULAIR) 4 MG chewable tablet Chew 4 mg by mouth at bedtime.   Yes Historical Provider, MD  pantoprazole (PROTONIX) 20 MG tablet Take 20 mg by mouth daily.   Yes Historical Provider, MD  spironolactone (ALDACTONE) 12.5 mg TABS Take 12.5 mg by mouth daily.   Yes Historical Provider, MD       Allergies: No Known Allergies  History   Social History  . Marital Status: Married    Spouse Name: N/A    Number of Children: N/A  . Years of Education: N/A   Occupational History  . Not on file.   Social History Main Topics  . Smoking status: Never Smoker   . Smokeless tobacco: Not on file  . Alcohol Use: No  . Drug Use: No  . Sexually Active: Yes   Other Topics Concern  . Not on file   Social History Narrative  . No narrative on file     Family History  Problem Relation Age  of Onset  . Diabetes Mother   . Heart Problems Father   . Autism Brother      ROS:  Please see the history of present illness.     All other systems reviewed and negative.    Physical Exam:   Blood pressure 128/86, pulse 60, height 6\' 3"  (1.905 m), weight 236 lb (107.049 kg). General: Well developed, well nourished male in no acute distress. Head: Normocephalic, atraumatic, sclera non-icteric, no xanthomas, nares are without discharge. EENT: normal Lymph Nodes:  none Back: without scoliosis/kyphosis , no CVA tendersness Neck: Negative for carotid bruits. JVD not elevated. Lungs: Clear bilaterally to auscultation without wheezes, rales, or rhonchi. Breathing is unlabored. Heart: RRR with  S1 S2. No  murmur , rubs, or gallops appreciated. Abdomen: Soft, non-tender, non-distended with normoactive bowel sounds. No hepatomegaly. No rebound/guarding. No obvious abdominal masses. Msk:  Strength and tone appear normal for age. Extremities: No clubbing or cyanosis. No edema.  Distal pedal pulses are 2+ and equal bilaterally. Skin: Warm and Dry Neuro: Alert and oriented X 3. CN III-XII intact Grossly normal sensory and motor function . Psych:  Responds to questions appropriately with a normal affect.      Labs: Cardiac Enzymes No results found for this basename: CKTOTAL, CKMB, TROPONINI,  in the last 72 hours CBC Lab Results  Component Value Date   WBC 5.4 07/09/2012   HGB 14.4 07/09/2012   HCT 41.0 07/09/2012   MCV 82.8 07/09/2012   PLT 170 07/09/2012   PROTIME: No results found for this basename: LABPROT, INR,  in the last 72 hours Chemistry No results found for this basename: NA, K, CL, CO2, BUN, CREATININE, CALCIUM, LABALBU, PROT, BILITOT, ALKPHOS, ALT, AST, GLUCOSE,  in the last 168 hours Lipids No results found for this basename: CHOL, HDL, LDLCALC, TRIG   BNP Pro B Natriuretic peptide (BNP)  Date/Time Value Range Status  07/10/2012  4:00 AM 1462.0* 0 - 125 pg/mL Final  07/06/2012  7:50 AM 3526.0* 0 - 125 pg/mL Final   Miscellaneous No results found for this basename: DDIMER    Radiology/Studies:  No results found.  EKG:  Sinus rhythm with a narrow QRS and evidence of biatrial enlargement   Assessment and Plan: v  Sherryl Manges

## 2012-10-22 NOTE — Assessment & Plan Note (Signed)
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 I reviewed this issue with the patient and his wife. He was quite disturbed about the exploration of the family history been concerned that that was our issue and not him.I explained to him that one of the concerns that we had is the cause of his cardiomyopathy and his potential relationship to his family thus his brother and potentially his children. They will need to get screened.

## 2012-10-22 NOTE — Assessment & Plan Note (Signed)
He has persistent cardiomyopathy. He has nonsustained ventricular tachycardia which prompted the use of a LifeVest  He is still wearing it.  His functional status however, is now by history class I. As such in the context of nonischemic cardiomyopathy he would not be criteria for ICD implantation. Hence, I would suggest objective assessment of functional status is he may have accommodated to his limitations. In the event that his cardiopulmonary stress test demonstrates functional limitations, he is agreeable to coming back in talking about defibrillator implantation; at that point we would review subcutaneous versus transvenous.

## 2012-10-23 ENCOUNTER — Ambulatory Visit (HOSPITAL_COMMUNITY): Payer: PRIVATE HEALTH INSURANCE | Attending: Internal Medicine

## 2012-10-23 DIAGNOSIS — I5043 Acute on chronic combined systolic (congestive) and diastolic (congestive) heart failure: Secondary | ICD-10-CM

## 2012-10-23 DIAGNOSIS — R079 Chest pain, unspecified: Secondary | ICD-10-CM | POA: Insufficient documentation

## 2012-10-23 DIAGNOSIS — I5041 Acute combined systolic (congestive) and diastolic (congestive) heart failure: Secondary | ICD-10-CM | POA: Insufficient documentation

## 2012-10-30 ENCOUNTER — Encounter: Payer: Self-pay | Admitting: Internal Medicine

## 2012-10-31 ENCOUNTER — Other Ambulatory Visit (HOSPITAL_COMMUNITY): Payer: Self-pay | Admitting: Adult Health

## 2012-11-12 ENCOUNTER — Other Ambulatory Visit: Payer: Self-pay | Admitting: Internal Medicine

## 2012-11-12 ENCOUNTER — Telehealth: Payer: Self-pay | Admitting: Physician Assistant

## 2012-11-12 ENCOUNTER — Other Ambulatory Visit (HOSPITAL_COMMUNITY): Payer: Self-pay | Admitting: *Deleted

## 2012-11-12 ENCOUNTER — Other Ambulatory Visit: Payer: Self-pay | Admitting: Physician Assistant

## 2012-11-12 MED ORDER — FUROSEMIDE 40 MG PO TABS: 40.0000 mg | ORAL_TABLET | ORAL | Status: DC | PRN

## 2012-11-12 MED ORDER — CARVEDILOL 12.5 MG PO TABS: 12.5000 mg | ORAL_TABLET | Freq: Two times a day (BID) | ORAL | Status: DC

## 2012-11-12 MED ORDER — HYDRALAZINE HCL 25 MG PO TABS: 25.0000 mg | ORAL_TABLET | Freq: Three times a day (TID) | ORAL | Status: DC

## 2012-11-12 NOTE — Telephone Encounter (Signed)
Pt called after hours regarding medication refill. He is trying to get his rx's transferred from [facility] pharmacy to [facility] and called [facility] pharmacy earlier. [facility] said they have not yet received information from pharmacy/doctor's office regarding transfer. The pt needs hydralazine tonight. I electronically prescribed this to the [facility] in [place] at pt's request, 25mg  q8hr disp #90 with 3 refills. The patient was instructed to call [facility] OP pharmacy tomorrow upon their opening to discuss what issue is delaying the transfer. He verbalized understanding and gratitude. Taneia Mealor PA-C

## 2012-11-17 ENCOUNTER — Encounter (HOSPITAL_COMMUNITY): Payer: Self-pay

## 2012-11-17 ENCOUNTER — Ambulatory Visit (HOSPITAL_COMMUNITY)
Admission: RE | Admit: 2012-11-17 | Discharge: 2012-11-17 | Disposition: A | Discharge status: Home / Self Care | Payer: PRIVATE HEALTH INSURANCE | Source: Ambulatory Visit | Attending: Cardiology | Admitting: Cardiology

## 2012-11-17 VITALS — BP 120/78 | HR 71 | Wt 234.0 lb

## 2012-11-17 DIAGNOSIS — I5022 Chronic systolic (congestive) heart failure: Secondary | ICD-10-CM | POA: Insufficient documentation

## 2012-11-17 DIAGNOSIS — I472 Ventricular tachycardia, unspecified: Secondary | ICD-10-CM | POA: Insufficient documentation

## 2012-11-17 DIAGNOSIS — I4729 Other ventricular tachycardia: Secondary | ICD-10-CM | POA: Insufficient documentation

## 2012-11-17 DIAGNOSIS — I428 Other cardiomyopathies: Secondary | ICD-10-CM | POA: Insufficient documentation

## 2012-11-17 MED ORDER — CARVEDILOL 12.5 MG PO TABS: 18.7500 mg | ORAL_TABLET | Freq: Two times a day (BID) | ORAL | Status: DC

## 2012-11-17 NOTE — Progress Notes (Signed)
Patient ID: Eric Eric, male   DOB: 1975-05-15, 37 y.o.   MRN: [id number]   Weight Range  215-217  Baseline proBNP    HPI:  Eric Eric is 37 y/o admitted with acute HF. Found to have EF 15% with restrictive diastolic parameters and mild to moderate RV dysfunction. 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 His father was just recently implanted with a HM II LVAD in February 2014.   He was admitted to [facility] 07/06/12 with increased dyspnea on exertion. Pertinent admission labs included Pro BNP 3526, Troponin 0.43>0.39>0.42, creatinine 1.55, Hemoglobin 14.5, TSH 1.46, and potassium 4.9. UDS negative. He is also O+. Discharge weight 216 lbs   RA = 13  RV = 62/11/145  PA = 61/36 (47)  PCW = 28  Fick cardiac output/index = 5.0/2.2  Thermo CO/CI 5.8/2.5  SVR = 1292  PVR = 3.3 Woods  FA sat = 96%  PA sat = 64%, 67%   [facility] CPX 08/01/12  Peak VO2: 37.10  VE/VCO2 slope: 22.40  Peak RER: 1.17  CPX 10/23/12 Resting HR: 60 Peak HR: 166 (90% age predicted max HR) BP rest: 111/74 BP peak: 182/109 Peak VO2: 30.7 (85.7% predicted peak VO2) VE/VCO2 slope: 24.8 OUES: 3.50 Peak RER: 1.32   ECHO 09/17/12: EF 20-25%; Diffuse hypokinesis. There is severe hypokinesis of the entire inferior myocardium' moderately dilated. Systolic function was mildly to moderately reduced.  09/17/12 Cr 1.29; BUN 17 10/22/12 Cr 1.5; BUN 16  Follow up:  Doing well. At last visit, increased lisinopril to 10 mg BID. Feels good. Had f/u CPX which showed preserved exercise capacity. Saw Dr. Graciela Husbands who felt he was not candidate for ICD due to Class I. Now wearing LifeVest mainly at night. Feels good. Able to most activities without problem. Taking lasix only when weight over 232.    ROS: All systems negative except as listed in HPI, PMH and Problem List.  Past Medical History  Diagnosis Date  . CHF (congestive heart failure)   . NSVT (nonsustained ventricular tachycardia) 07/10/2012    Current Outpatient Prescriptions  Medication Sig Dispense  Refill  . carvedilol (COREG) 12.5 MG tablet Take 1 tablet (12.5 mg total) by mouth 2 (two) times daily with a meal.  60 tablet  6  . DIGOX 125 MCG tablet TAKE 1 TABLET BY MOUTH DAILY.  30 tablet  6  . furosemide (LASIX) 40 MG tablet Take 1 tablet (40 mg total) by mouth as needed. Only take lasix if weight is 232 pounds or greater.  30 tablet  6  . hydrALAZINE (APRESOLINE) 25 MG tablet Take 1 tablet (25 mg total) by mouth every 8 (eight) hours.  90 tablet  3  . lisinopril (PRINIVIL,ZESTRIL) 10 MG tablet Take 1 tablet (10 mg total) by mouth 2 (two) times daily.  60 tablet  3  . montelukast (SINGULAIR) 4 MG chewable tablet Chew 4 mg by mouth at bedtime.      . pantoprazole (PROTONIX) 20 MG tablet Take 20 mg by mouth daily.      Marland Kitchen spironolactone (ALDACTONE) 12.5 mg TABS Take 12.5 mg by mouth daily.       No current facility-administered medications for this encounter.     PHYSICAL EXAM: Filed Vitals:   11/17/12 1132  BP: 120/78  Pulse: 71  Weight: 234 lb (106.142 kg)  SpO2: 99%    General:  Well appearing. No resp difficulty HEENT: normal Neck: supple. JVP flat. Carotids 2+ bilaterally; no bruits. No lymphadenopathy or thryomegaly appreciated. Cor: PMI  normal. Regular rate & rhythm. No rubs, gallops or murmurs. Lungs: clear Abdomen: soft, nontender, nondistended. No hepatosplenomegaly. No bruits or masses. Good bowel sounds. Extremities: no cyanosis, clubbing, rash, edema Neuro: alert & orientedx3, cranial nerves grossly intact. Moves all 4 extremities w/o difficulty. Affect pleasant.  ASSESSMENT AND PLAN  1. Chronic systolic HF due to NICM; EF 20-25% with diffuse hypokinesis - Patient currently NYHA I and volume status good.  - Does not meet criteria for ICD at this time d/t functional status, discussed with patient the importance of still wearing his LifeVest for as many hours out of the day that he can tolerate. If s/s worsen in any extent told to call and will refer to EP for ICD  implantation.  - Will titrate carvedilol to 18.75 mg BID, instructed to call if any increased dizziness or fatigue. - Continue ACE-I, Eric Eric and digoxin - Labs today with guide-it, with tiration of meds will bring back in 2 weeks for trial  2. NSVT - no s/s of NSVT will continue to monitor and continue to wear lifevest   Patient seen and examined with Ulla Potash, NP. We discussed all aspects of the encounter. I agree with the assessment and plan as stated above. Continues to do well despite severe LV dysfunction. I told him that given his Dad's course I suspect his EF may not recover and he will eventually need advanced therapies. Now NYHA I and volume status looks good. Will increase carvedilol to 18.75 bid as tolerated. Continue to follow closely. F/u 6 weeks.   Eric Bensimhon,MD 12:01 PM

## 2012-11-17 NOTE — Patient Instructions (Addendum)
Will call with lab results.   Increase carvedilol 18.75 mg BID.  Follow up 6 weeks.

## 2013-01-08 ENCOUNTER — Ambulatory Visit (HOSPITAL_COMMUNITY)
Admission: RE | Admit: 2013-01-08 | Discharge: 2013-01-08 | Disposition: A | Discharge status: Home / Self Care | Payer: PRIVATE HEALTH INSURANCE | Source: Ambulatory Visit | Attending: Internal Medicine | Admitting: Internal Medicine

## 2013-01-08 ENCOUNTER — Encounter: Payer: Self-pay | Admitting: Internal Medicine

## 2013-01-08 VITALS — BP 118/76 | HR 60 | Wt 243.5 lb

## 2013-01-08 DIAGNOSIS — I5022 Chronic systolic (congestive) heart failure: Secondary | ICD-10-CM

## 2013-01-08 DIAGNOSIS — I472 Ventricular tachycardia: Secondary | ICD-10-CM

## 2013-01-08 LAB — DIGOXIN LEVEL: Digoxin Level: 0.6 ng/mL — ABNORMAL LOW (ref 0.8–2.0)

## 2013-01-08 MED ORDER — CARVEDILOL 25 MG PO TABS: 25.0000 mg | ORAL_TABLET | Freq: Two times a day (BID) | ORAL | Status: DC

## 2013-01-08 MED ORDER — SPIRONOLACTONE 25 MG PO TABS: 25.0000 mg | ORAL_TABLET | Freq: Every day | ORAL | Status: DC

## 2013-01-08 NOTE — Progress Notes (Signed)
Patient ID: Eric Daniel, male   DOB: 03-30-1976, 37 y.o.   MRN: [id number]  Weight Range  215-217  Baseline proBNP    HPI:  Eric Daniel is 37 y/o admitted with acute HF. Found to have EF 15% with restrictive diastolic parameters and mild to moderate RV dysfunction. 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 His father was just recently implanted with a HM II LVAD in February 2014.   He was admitted to [facility] 07/06/12 with increased dyspnea on exertion. Pertinent admission labs included Pro BNP 3526, Troponin 0.43>0.39>0.42, creatinine 1.55, Hemoglobin 14.5, TSH 1.46, and potassium 4.9. UDS negative. He is also O+. Discharge weight 216 lbs   RA = 13  RV = 62/11/145  PA = 61/36 (47)  PCW = 28  Fick cardiac output/index = 5.0/2.2  Thermo CO/CI 5.8/2.5  SVR = 1292  PVR = 3.3 Woods  FA sat = 96%  PA sat = 64%, 67%   [facility] CPX 08/01/12  Peak VO2: 37.10  VE/VCO2 slope: 22.40  Peak RER: 1.17  CPX 10/23/12 Resting HR: 60 Peak HR: 166 (90% age predicted max HR) BP rest: 111/74 BP peak: 182/109 Peak VO2: 30.7 (85.7% predicted peak VO2) VE/VCO2 slope: 24.8 OUES: 3.50 Peak RER: 1.32  ECHO 09/17/12: EF 20-25%; Diffuse hypokinesis. There is severe hypokinesis of the entire inferior myocardium' moderately dilated. Systolic function was mildly to moderately reduced.  09/17/12 Cr 1.29; BUN 17 10/22/12 Cr 1.5; BUN 16  Follow up: Last visit increased carvedilol to 18.75 mg BID which he tolerated. 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 Wearing LifeVest at night and has had some false alarms (called company today, no NSVT). Doing all activities. Denies SOB, orthopnea, edema, or CP. Weight at home 232-238 lbs. Taking medications as prescribed.   FH: Father with nonischemic cardiomyopathy.   SH: No smoking or ETOH. 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   ROS: All systems negative except as listed in HPI, PMH and Problem List.  Past Medical History  Diagnosis Date  . CHF (congestive heart failure)   .  NSVT (nonsustained ventricular tachycardia) 07/10/2012    Current Outpatient Prescriptions  Medication Sig Dispense Refill  . carvedilol (COREG) 12.5 MG tablet Take 1.5 tablets (18.75 mg total) by mouth 2 (two) times daily with a meal.  90 tablet  6  . DIGOX 125 MCG tablet TAKE 1 TABLET BY MOUTH DAILY.  30 tablet  6  . furosemide (LASIX) 40 MG tablet Take 1 tablet (40 mg total) by mouth as needed. Only take lasix if weight is 232 pounds or greater.  30 tablet  6  . hydrALAZINE (APRESOLINE) 25 MG tablet Take 1 tablet (25 mg total) by mouth every 8 (eight) hours.  90 tablet  3  . lisinopril (PRINIVIL,ZESTRIL) 10 MG tablet Take 1 tablet (10 mg total) by mouth 2 (two) times daily.  60 tablet  3  . spironolactone (ALDACTONE) 25 MG tablet Take 12.5 mg by mouth daily.       No current facility-administered medications for this encounter.    Filed Vitals:   01/08/13 1142  BP: 118/76  Pulse: 60  Weight: 243 lb 8 oz (110.451 kg)  SpO2: 98%   PHYSICAL EXAM: General:  Well appearing. No resp difficulty HEENT: normal Neck: supple. JVP flat. Carotids 2+ bilaterally; no bruits. No lymphadenopathy or thryomegaly appreciated. Cor: PMI normal. Regular rate & rhythm. No rubs, gallops or murmurs. Lungs: clear Abdomen: soft, nontender, nondistended. No hepatosplenomegaly. No bruits or masses. Good bowel sounds.  Extremities: no cyanosis, clubbing, rash, edema Neuro: alert & orientedx3, cranial nerves grossly intact. Moves all 4 extremities w/o difficulty. Affect pleasant.  ASSESSMENT AND PLAN  1. 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  Patient currently NYHA I and volume status good.  Does not meet criteria for ICD at this time for EF less than 35% due to functional status (NYHA class I). - Downloads from Lifevest show no arrhythmias.  Patient to continue wearing it at night.  - Will increase coreg to 25 mg BID, instructed to call if any increased  dizziness or fatigue. - Increase spironolactone to 25 mg daily. - Continue ACE-I and digoxin - Labs today with guide-it including digoxin. Will get follow up BMET with increase in spironolactone.  - Would get screening echoes for cardiomyopathy for his brother and his 79 year-old son given strong family history.   2. NSVT: No arrhythmias on LifeVest download, no tachypalpitations.  He is wearing the LifeVest at night.   F/U 3 months.  Ulla Potash B NP-C 12:33 PM  Patient seen with NP, agree with the above note with appropriate changes made.  Increase Coreg and spironolactone.  No NSVT on LifeVest downloads, can continue to wear at night.  Not ICD candidate at this point as NYHA class I.   Marca Ancona 01/09/2013

## 2013-01-08 NOTE — Patient Instructions (Addendum)
Increase coreg to 25 mg twice a day. You have 12.5 mg tablets take 2 tablets twice a day until you run out and then new prescription will be 25 mg tablet and you can take 1 tablet twice a day.  Increase spironolactone to 25 mg daily (1 tablet) daily.  Stop hydralazine.  Follow up next week with labs.  Follow up 3 months.  Do the following things EVERYDAY: 1) Weigh yourself in the morning before breakfast. Write it down and keep it in a log. 2) Take your medicines as prescribed 3) Eat low salt foods-Limit salt (sodium) to 2000 mg per day.  4) Stay as active as you can everyday 5) Limit all fluids for the day to less than 2 liters 6)

## 2013-01-11 ENCOUNTER — Other Ambulatory Visit (HOSPITAL_COMMUNITY): Payer: Self-pay | Admitting: Adult Health

## 2013-01-14 ENCOUNTER — Encounter: Payer: Self-pay | Admitting: Internal Medicine

## 2013-01-28 ENCOUNTER — Other Ambulatory Visit (HOSPITAL_COMMUNITY): Payer: Self-pay | Admitting: Anesthesiology

## 2013-01-28 DIAGNOSIS — I5022 Chronic systolic (congestive) heart failure: Secondary | ICD-10-CM

## 2013-02-04 ENCOUNTER — Telehealth: Payer: Self-pay | Admitting: *Deleted

## 2013-02-04 NOTE — Telephone Encounter (Signed)
A user error has taken place: encounter opened in error, closed for administrative reasons.

## 2013-02-06 ENCOUNTER — Telehealth (HOSPITAL_COMMUNITY): Payer: Self-pay | Admitting: Cardiology

## 2013-02-06 DIAGNOSIS — I5022 Chronic systolic (congestive) heart failure: Secondary | ICD-10-CM

## 2013-02-06 MED ORDER — FUROSEMIDE 40 MG PO TABS: 40.0000 mg | ORAL_TABLET | ORAL | Status: DC | PRN

## 2013-02-06 MED ORDER — LISINOPRIL 10 MG PO TABS: 10.0000 mg | ORAL_TABLET | Freq: Two times a day (BID) | ORAL | Status: DC

## 2013-02-06 MED ORDER — CARVEDILOL 25 MG PO TABS: 25.0000 mg | ORAL_TABLET | Freq: Two times a day (BID) | ORAL | Status: DC

## 2013-02-06 MED ORDER — SPIRONOLACTONE 25 MG PO TABS: 25.0000 mg | ORAL_TABLET | Freq: Every day | ORAL | Status: DC

## 2013-02-06 NOTE — Telephone Encounter (Signed)
Requested Prescriptions   Signed Prescriptions Disp Refills  . carvedilol (COREG) 25 MG tablet 60 tablet 6    Sig: Take 1 tablet (25 mg total) by mouth 2 (two) times daily with a meal.    Authorizing Provider: Dolores Patty    Ordering User: JEFFRIES, CHANTEL M  . furosemide (LASIX) 40 MG tablet 30 tablet 6    Sig: Take 1 tablet (40 mg total) by mouth as needed. Only take lasix if weight is 232 pounds or greater.    Authorizing Provider: Dolores Patty    Ordering User: JEFFRIES, CHANTEL M  . lisinopril (PRINIVIL,ZESTRIL) 10 MG tablet 60 tablet 3    Sig: Take 1 tablet (10 mg total) by mouth 2 (two) times daily.    Authorizing Provider: Dolores Patty    Ordering User: JEFFRIES, Milagros Reap  . spironolactone (ALDACTONE) 25 MG tablet 30 tablet 6    Sig: Take 1 tablet (25 mg total) by mouth daily.    Authorizing Provider: Dolores Patty    Ordering User: JEFFRIES, Milagros Reap

## 2013-02-19 ENCOUNTER — Other Ambulatory Visit (HOSPITAL_COMMUNITY): Payer: Self-pay | Admitting: *Deleted

## 2013-02-19 ENCOUNTER — Other Ambulatory Visit: Payer: Self-pay

## 2013-02-19 DIAGNOSIS — I5022 Chronic systolic (congestive) heart failure: Secondary | ICD-10-CM

## 2013-02-19 MED ORDER — FUROSEMIDE 40 MG PO TABS: 40.0000 mg | ORAL_TABLET | ORAL | Status: DC | PRN

## 2013-03-02 ENCOUNTER — Other Ambulatory Visit: Payer: Self-pay | Admitting: Internal Medicine

## 2013-03-27 ENCOUNTER — Encounter: Payer: Self-pay | Admitting: Internal Medicine

## 2013-03-27 ENCOUNTER — Ambulatory Visit (HOSPITAL_COMMUNITY)
Admission: RE | Admit: 2013-03-27 | Discharge: 2013-03-27 | Disposition: A | Discharge status: Home / Self Care | Payer: 59 | Source: Ambulatory Visit | Attending: Internal Medicine | Admitting: Internal Medicine

## 2013-03-27 VITALS — BP 96/58 | HR 60 | Wt 246.8 lb

## 2013-03-27 DIAGNOSIS — I5022 Chronic systolic (congestive) heart failure: Secondary | ICD-10-CM

## 2013-03-27 NOTE — Progress Notes (Signed)
Patient ID: Eric Daniel, male   DOB: 1975/08/20, 37 y.o.   MRN: [id number]   Weight Range  215-217  Baseline proBNP    HPI:  Eric Daniel is 37 y/o admitted with acute HF. Found to have EF 15% with restrictive diastolic parameters and mild to moderate RV dysfunction. 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 Eric Daniel was just recently implanted with a HM II LVAD in February 2014.   He was admitted to [facility] 07/06/12 with increased dyspnea on exertion. Pertinent admission labs included Pro BNP 3526, Troponin 0.43>0.39>0.42, creatinine 1.55, Hemoglobin 14.5, TSH 1.46, and potassium 4.9. UDS negative. He is also O+. Discharge weight 216 lbs   RA = 13  RV = 62/11/145  PA = 61/36 (47)  PCW = 28  Fick cardiac output/index = 5.0/2.2  Thermo CO/CI 5.8/2.5  SVR = 1292  PVR = 3.3 Woods  FA sat = 96%  PA sat = 64%, 67%   [facility] CPX 08/01/12  Peak VO2: 37.10  VE/VCO2 slope: 22.40  Peak RER: 1.17  CPX 10/23/12 Resting HR: 60 Peak HR: 166 (90% age predicted max HR) BP rest: 111/74 BP peak: 182/109 Peak VO2: 30.7 (85.7% predicted peak VO2) VE/VCO2 slope: 24.8 OUES: 3.50 Peak RER: 1.32  ECHO 09/17/12: EF 20-25%; Diffuse hypokinesis. There is severe hypokinesis of the entire inferior myocardium' moderately dilated.   09/17/12 Cr 1.29; BUN 17 10/22/12 Cr 1.5; BUN 16  He returns for follow up. Last visit carvedilol was increased to 25 mg and spironolactone was increased to 25 mg daily. Denies SOB/PND/Orthopnea/dizziness. Not using lifevest. Taking all medications.  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     FH: Daniel with nonischemic cardiomyopathy.   SH: No smoking or ETOH. 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   ROS: All systems negative except as listed in HPI, PMH and Problem List.  Past Medical History  Diagnosis Date  . CHF (congestive heart failure)   . NSVT (nonsustained ventricular tachycardia) 07/10/2012    Current Outpatient Prescriptions  Medication Sig Dispense Refill  . carvedilol  (COREG) 25 MG tablet Take 1 tablet (25 mg total) by mouth 2 (two) times daily with a meal.  60 tablet  6  . DIGOX 125 MCG tablet TAKE 1 TABLET BY MOUTH DAILY.  30 tablet  6  . furosemide (LASIX) 40 MG tablet Take 1 tablet (40 mg total) by mouth as needed. Only take lasix if weight is 232 pounds or greater.  30 tablet  6  . lisinopril (PRINIVIL,ZESTRIL) 10 MG tablet Take 1 tablet (10 mg total) by mouth 2 (two) times daily.  60 tablet  3  . spironolactone (ALDACTONE) 25 MG tablet Take 1 tablet (25 mg total) by mouth daily.  30 tablet  6   No current facility-administered medications for this encounter.    Filed Vitals:   03/27/13 0926  BP: 96/58  Pulse: 60  Weight: 246 lb 12 oz (111.925 kg)  SpO2: 98%   PHYSICAL EXAM: General:  Well appearing. No resp difficulty HEENT: normal Neck: supple. JVP flat. Carotids 2+ bilaterally; no bruits. No lymphadenopathy or thryomegaly appreciated. Cor: PMI normal. Regular rate & rhythm. No rubs, gallops or murmurs. Lungs: clear Abdomen: soft, nontender, nondistended. No hepatosplenomegaly. No bruits or masses. Good bowel sounds. Extremities: no cyanosis, clubbing, rash, edema Neuro: alert & orientedx3, cranial nerves grossly intact. Moves all 4 extremities w/o difficulty. Affect pleasant.  ASSESSMENT AND PLAN  1. Chronic systolic HF due to NICM (suspect familial); 09/2012 EF 20-25%  with diffuse hypokinesis.  NYHA I. Continues to do well.  Appears euvolemic despite weight gain. Does not meet criteria for ICD at this time.   - On goal coreg to 25 mg BID. Continue digoxin 0.125 mg daily.  - Continue lasix as needed and continue spironolactone 25 mg daily.  - Continue lisinopril 10 mg twice a day. Will not uptitrate due soft BP - Check labs for Guide IT. Repeat ECHO next week.   Needs to consider screening echoes for cardiomyopathy for Eric Daniel and Eric Daniel given family history. If wants to pursue will need genetic counseling.     Follow up in 3 months   CLEGG,AMY NP-C 9:32 AM

## 2013-03-27 NOTE — Patient Instructions (Signed)
Follow up in 3 months   Do the following things EVERYDAY: 1) Weigh yourself in the morning before breakfast. Write it down and keep it in a log. 2) Take your medicines as prescribed 3) Eat low salt foods-Limit salt (sodium) to 2000 mg per day.  4) Stay as active as you can everyday 5) Limit all fluids for the day to less than 2 liters  

## 2013-04-01 ENCOUNTER — Encounter (HOSPITAL_COMMUNITY): Payer: PRIVATE HEALTH INSURANCE

## 2013-04-08 ENCOUNTER — Ambulatory Visit (HOSPITAL_COMMUNITY)
Admission: RE | Admit: 2013-04-08 | Discharge: 2013-04-08 | Disposition: A | Discharge status: Home / Self Care | Payer: 59 | Source: Ambulatory Visit | Attending: Internal Medicine | Admitting: Internal Medicine

## 2013-04-08 DIAGNOSIS — I059 Rheumatic mitral valve disease, unspecified: Secondary | ICD-10-CM

## 2013-04-08 DIAGNOSIS — I5022 Chronic systolic (congestive) heart failure: Secondary | ICD-10-CM

## 2013-04-08 DIAGNOSIS — I509 Heart failure, unspecified: Secondary | ICD-10-CM | POA: Insufficient documentation

## 2013-04-08 NOTE — Progress Notes (Signed)
Echo Lab  2D Echocardiogram completed.  Lisbeth Puller L Kathleen Likins, RDCS 04/08/2013 8:25 AM

## 2013-05-16 ENCOUNTER — Telehealth: Payer: Self-pay | Admitting: Cardiology

## 2013-05-16 NOTE — Telephone Encounter (Signed)
Patient called, with tooth pain started Friday.   No fever and chills, no chest pain or PND or orthapnea  Instructed pt to seek medical attention for dental care ASAP.  If he has fever or any cardiac symptoms, he understands he need to call us or come to ER.  Haydee Salter, MD.

## 2013-05-25 ENCOUNTER — Encounter: Payer: Self-pay | Admitting: Internal Medicine

## 2013-05-25 ENCOUNTER — Telehealth (HOSPITAL_COMMUNITY): Payer: Self-pay | Admitting: Cardiology

## 2013-05-25 NOTE — Telephone Encounter (Signed)
Pt called to request appt Pt states he has been having off and on chest discomfort and L side jaw pain x 1-2 weeks (was told it was not dental related) Denies: active chest pains, SOB, vision changes, HA, slurred speech, and dizziness Pt given appt for next available: 05/27/13 Pt advised to go to nearest ER if any of the above sx begin Pt voiced understanding

## 2013-05-25 NOTE — Telephone Encounter (Signed)
05-25-13 called pt 154pm, per pt , he wants to make an appt with Dr. Gala Romney instead, gave pt the number to huis office and pt will call to set up that appt/mt

## 2013-05-25 NOTE — Telephone Encounter (Signed)
Will send to [facility] to schedule appt

## 2013-05-26 ENCOUNTER — Encounter (HOSPITAL_COMMUNITY): Payer: 59

## 2013-05-26 NOTE — Telephone Encounter (Signed)
Pt to see Bensimhon tomorrow

## 2013-05-27 ENCOUNTER — Ambulatory Visit (HOSPITAL_COMMUNITY)
Admission: RE | Admit: 2013-05-27 | Discharge: 2013-05-27 | Disposition: A | Discharge status: Home / Self Care | Payer: 59 | Source: Ambulatory Visit | Attending: Internal Medicine | Admitting: Internal Medicine

## 2013-05-27 VITALS — BP 114/62 | HR 76 | Wt 255.0 lb

## 2013-05-27 DIAGNOSIS — I4729 Other ventricular tachycardia: Secondary | ICD-10-CM | POA: Insufficient documentation

## 2013-05-27 DIAGNOSIS — I509 Heart failure, unspecified: Secondary | ICD-10-CM | POA: Insufficient documentation

## 2013-05-27 DIAGNOSIS — I472 Ventricular tachycardia, unspecified: Secondary | ICD-10-CM | POA: Insufficient documentation

## 2013-05-27 DIAGNOSIS — Z79899 Other long term (current) drug therapy: Secondary | ICD-10-CM | POA: Insufficient documentation

## 2013-05-27 DIAGNOSIS — I5022 Chronic systolic (congestive) heart failure: Secondary | ICD-10-CM | POA: Insufficient documentation

## 2013-05-27 DIAGNOSIS — I428 Other cardiomyopathies: Secondary | ICD-10-CM | POA: Insufficient documentation

## 2013-05-27 LAB — BASIC METABOLIC PANEL
BUN: 15 mg/dL (ref 6–23)
CALCIUM: 9.6 mg/dL (ref 8.4–10.5)
CO2: 28 meq/L (ref 19–32)
Chloride: 99 mEq/L (ref 96–112)
Creatinine, Ser: 1.31 mg/dL (ref 0.50–1.35)
GFR calc Af Amer: 79 mL/min — ABNORMAL LOW (ref >90)
GFR, EST NON AFRICAN AMERICAN: 68 mL/min — AB (ref >90)
Glucose, Bld: 97 mg/dL (ref 70–99)
Potassium: 4.4 mEq/L (ref 3.7–5.3)
SODIUM: 141 meq/L (ref 137–147)

## 2013-05-27 LAB — DIGOXIN LEVEL: Digoxin Level: 0.7 ng/mL — ABNORMAL LOW (ref 0.8–2.0)

## 2013-05-27 LAB — PRO B NATRIURETIC PEPTIDE: PRO B NATRI PEPTIDE: 433.7 pg/mL — AB (ref 0–125)

## 2013-05-27 MED ORDER — LISINOPRIL 10 MG PO TABS: 20.0000 mg | ORAL_TABLET | Freq: Two times a day (BID) | ORAL | Status: DC

## 2013-05-27 NOTE — Patient Instructions (Signed)
Increase your lisinopril to 20 mg (2 tablets) in the am and 20 mg (2 tablets) in the pm. If you tolerate than call us and we can increase your pill size to 20 mg tablets and then you will take 1 tablet every am and 1 tablet every pm.  Will schedule you for Cardiac MRI.  Will need to come back in 1 week for labs.  Call any issues.   F/U 6-8 weeks.

## 2013-05-27 NOTE — Progress Notes (Signed)
Patient ID: Eric Daniel, male   DOB: 1976-04-06, 38 y.o.   MRN: [id number]  Weight Range  215-217  Baseline proBNP    HPI:  Eric Daniel is 38 y/o admitted with acute HF. Found to have EF 15% with restrictive diastolic parameters and mild to moderate RV dysfunction. 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 His father was just recently implanted with a HM II LVAD in February 2014.   He was admitted to [facility] 07/06/12 with increased dyspnea on exertion. Pertinent admission labs included Pro BNP 3526, Troponin 0.43>0.39>0.42, creatinine 1.55, Hemoglobin 14.5, TSH 1.46, and potassium 4.9. UDS negative. He is also O+. Discharge weight 216 lbs   RA = 13  RV = 62/11/145  PA = 61/36 (47)  PCW = 28  Fick cardiac output/index = 5.0/2.2  Thermo CO/CI 5.8/2.5  SVR = 1292  PVR = 3.3 Woods  FA sat = 96%  PA sat = 64%, 67%   [facility] CPX 08/01/12  Peak VO2: 37.10  VE/VCO2 slope: 22.40  Peak RER: 1.17  CPX 10/23/12 Resting HR: 60 Peak HR: 166 (90% age predicted max HR) BP rest: 111/74 BP peak: 182/109 Peak VO2: 30.7 (85.7% predicted peak VO2) VE/VCO2 slope: 24.8 OUES: 3.50 Peak RER: 1.32  ECHO 09/17/12: EF 20-25%; Diffuse hypokinesis. There is severe hypokinesis of the entire inferior myocardium' moderately dilated.  ECHO 04/08/14: EF 15%; Diff HK, grade II DD, RV mod dilated  Follow up: Doing well. Reports he has had tooth pain off and on for past 2 weeks. Went to the dentist and everything looks good. Wife has had sinus infection and he thinks he is developing one as well. Denies SOB, orthopnea, CP or dizziness. Taking all medications.  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 He has not really pushed himself to see if he can do much. Can walk around the grocery store with no issues.   09/17/12 Cr 1.29; BUN 17 10/22/12 Cr 1.5; BUN 16 12/14 Cr 1.42, BUN 15, K+ 4.2  FH: Father with nonischemic cardiomyopathy and LVAD  SH: No smoking or ETOH. 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   ROS: All systems negative  except as listed in HPI, PMH and Problem List.  Past Medical History  Diagnosis Date  . CHF (congestive heart failure)   . NSVT (nonsustained ventricular tachycardia) 07/10/2012    Current Outpatient Prescriptions  Medication Sig Dispense Refill  . carvedilol (COREG) 25 MG tablet Take 1 tablet (25 mg total) by mouth 2 (two) times daily with a meal.  60 tablet  6  . DIGOX 125 MCG tablet TAKE 1 TABLET BY MOUTH DAILY.  30 tablet  6  . furosemide (LASIX) 40 MG tablet Take 1 tablet (40 mg total) by mouth as needed. Only take lasix if weight is 232 pounds or greater.  30 tablet  6  . lisinopril (PRINIVIL,ZESTRIL) 10 MG tablet Take 2 tablets (20 mg total) by mouth 2 (two) times daily.  120 tablet  3  . spironolactone (ALDACTONE) 25 MG tablet Take 1 tablet (25 mg total) by mouth daily.  30 tablet  6   No current facility-administered medications for this encounter.   Filed Vitals:   05/27/13 1240  BP: 114/62  Pulse: 76  Weight: 255 lb (115.667 kg)  SpO2: 99%    PHYSICAL EXAM: General:  Well appearing. No resp difficulty HEENT: normal Neck: supple. JVP flat. Carotids 2+ bilaterally; no bruits. No lymphadenopathy or thryomegaly appreciated. Cor: PMI normal. Regular rate & rhythm. No rubs, gallops  or murmurs. Lungs: clear Abdomen: soft, nontender, nondistended. No hepatosplenomegaly. No bruits or masses. Good bowel sounds. Extremities: no cyanosis, clubbing, rash, edema Neuro: alert & orientedx3, cranial nerves grossly intact. Moves all 4 extremities w/o difficulty. Affect pleasant.  ASSESSMENT AND PLAN  1. Chronic systolic HF: due to NICM (suspect familial); 03/2013 EF 15% with diffuse hypokinesis, grade II DD - NYHA I- II symptoms. Patient reports he can perform all ADLs and still coaches basketball, however he is pretty sedentary and does not really active so not sure of limitations. I have asked him to try and start walking more and exercising some with the team, but to stop if he  becomes fatigued, SOB or has palpitations. Does not meet criteria for ICD at this time.   - Volume status stable. He has gained quite a bit of weight, since last year but does not look like fluid. He has been taking lasix q Monday, Wednesday and Friday, will continue.  - On goal coreg to 25 mg BID. Continue digoxin 0.125 mg daily. Will check digoxin level today.  - Will increase lisinopril 20 mg twice a day. If he tolerates and BMET ok in 7-10 days will send new prescription for 20 mg tablets. - Check labs for Guide IT.  - Will schedule cMRI to quantify EF.   Follow up 6-8 weeks  Ulla Potashosgrove, Gemayel Mascio B NP-C 10:07 AM

## 2013-05-29 ENCOUNTER — Telehealth (HOSPITAL_COMMUNITY): Payer: Self-pay | Admitting: Anesthesiology

## 2013-05-29 DIAGNOSIS — I5022 Chronic systolic (congestive) heart failure: Secondary | ICD-10-CM

## 2013-05-29 NOTE — Telephone Encounter (Signed)
Discussed with Dr. Gala Romney about cMRI. He would prefer repeating CPX instead of cMRI. Will place order. Left message for patient to call clinic back.   Ulla Potash B NP-C 1:11 PM

## 2013-06-01 ENCOUNTER — Other Ambulatory Visit (HOSPITAL_COMMUNITY): Payer: Self-pay | Admitting: Adult Health

## 2013-06-24 ENCOUNTER — Encounter (HOSPITAL_COMMUNITY): Payer: 59

## 2013-06-25 ENCOUNTER — Encounter: Payer: Self-pay | Admitting: *Deleted

## 2013-06-29 ENCOUNTER — Ambulatory Visit (HOSPITAL_COMMUNITY): Payer: 59 | Attending: Internal Medicine

## 2013-06-29 DIAGNOSIS — I5022 Chronic systolic (congestive) heart failure: Secondary | ICD-10-CM

## 2013-07-08 ENCOUNTER — Ambulatory Visit (HOSPITAL_COMMUNITY)
Admission: RE | Admit: 2013-07-08 | Discharge: 2013-07-08 | Disposition: A | Discharge status: Home / Self Care | Payer: 59 | Source: Ambulatory Visit | Attending: Internal Medicine | Admitting: Internal Medicine

## 2013-07-08 ENCOUNTER — Encounter (HOSPITAL_COMMUNITY): Payer: Self-pay

## 2013-07-08 VITALS — BP 124/86 | HR 59 | Resp 18 | Wt 256.0 lb

## 2013-07-08 DIAGNOSIS — I5022 Chronic systolic (congestive) heart failure: Secondary | ICD-10-CM | POA: Insufficient documentation

## 2013-07-08 DIAGNOSIS — N529 Male erectile dysfunction, unspecified: Secondary | ICD-10-CM

## 2013-07-08 MED ORDER — SILDENAFIL CITRATE 100 MG PO TABS
100.0000 mg | ORAL_TABLET | Freq: Every day | ORAL | Status: DC | PRN
Start: 2013-07-08 — End: 2013-10-27

## 2013-07-08 MED ORDER — LISINOPRIL 20 MG PO TABS: 20.0000 mg | ORAL_TABLET | Freq: Two times a day (BID) | ORAL | Status: DC

## 2013-07-08 NOTE — Patient Instructions (Addendum)
Doing well.  Try to start doing as much as you can, call if any dizziness or increased fatigue.  Follow up in 4 months  Do the following things EVERYDAY: 1) Weigh yourself in the morning before breakfast. Write it down and keep it in a log. 2) Take your medicines as prescribed 3) Eat low salt foods-Limit salt (sodium) to 2000 mg per day.  4) Stay as active as you can everyday 5) Limit all fluids for the day to less than 2 liters 6)

## 2013-07-08 NOTE — Progress Notes (Signed)
Patient ID: Eric SeverinWilliam C Aument, male   DOB: [birth year], 38 y.o.   MRN: [id number]  PCP: Dr. Milinda [facility]isKawanta [facility])  HPI:  Mr. Darrin Nipperass is 38 y/o admitted with acute HF. Found to have EF 15% with restrictive diastolic parameters and mild to moderate RV dysfunction. 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 His father was just recently implanted with a HM II LVAD in February 2014.   He was admitted to [facility] 07/06/12 with increased dyspnea on exertion. Pertinent admission labs included Pro BNP 3526, Troponin 0.43>0.39>0.42, creatinine 1.55, Hemoglobin 14.5, TSH 1.46, and potassium 4.9. UDS negative. He is also O+. Discharge weight 216 lbs   RA = 13  RV = 62/11/145  PA = 61/36 (47)  PCW = 28  Fick cardiac output/index = 5.0/2.2  Thermo CO/CI 5.8/2.5  SVR = 1292  PVR = 3.3 Woods  FA sat = 96%  PA sat = 64%, 67%   [facility] CPX 08/01/12  Peak VO2: 37.10  VE/VCO2 slope: 22.40  Peak RER: 1.17  CPX 10/23/12 Resting HR: 60 Peak HR: 166 (90% age predicted max HR) BP rest: 111/74 BP peak: 182/109 Peak VO2: 30.7 (85.7% predicted peak VO2) VE/VCO2 slope: 24.8 OUES: 3.50 Peak RER: 1.32  ECHO 09/17/12: EF 20-25%; Diffuse hypokinesis. There is severe hypokinesis of the entire inferior myocardium' moderately dilated.  ECHO 04/08/13: EF 15%; Diff HK, grade II DD, RV mod dilated  CPX 06/19/2013: Resting HR 64: Peak HR 168 (92% predicted max HR) BP rest 118/80 BP peak: 164/90 Peak VO2: 27.9 (82.4% predicted VO2) VE/VCO2 slope: 26 OUES: 3.24 Peak RER: 1.27 - Mild desaturation during exercise from 99% at rest to 92% peak exercise  Follow up: Last visit increased lisinopril to 20 mg BID, which he tolerated. He also had repeat CPX, refer above for results. Trying to increased exercise and now is doing more such as yard work and having minimal fatigue with strenuous activity. Walking about 8-10 miles a week with minimal issues, he reports going up hills gets some fatigue but could be related to deconditioning. Denies  SOB, PND, orthopnea or CP. Weight at home 250 lbs. Following a low salt diet and drinking less than 2L a day. 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    09/17/12 Cr 1.29; BUN 17 10/22/12 Cr 1.5; BUN 16 12/14 Cr 1.42, BUN 15, K+ 4.2 05/2013: Cr 1.31, K+ 4.4, pro-BNP 433.7, dig level 0.7   FH: Father with nonischemic cardiomyopathy and LVAD  SH: No smoking or ETOH. 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   ROS: All systems negative except as listed in HPI, PMH and Problem List.  Past Medical History  Diagnosis Date  . NSVT (nonsustained ventricular tachycardia) 07/10/2012  . CHF (congestive heart failure) 06/2012    Current Outpatient Prescriptions  Medication Sig Dispense Refill  . carvedilol (COREG) 25 MG tablet Take 1 tablet (25 mg total) by mouth 2 (two) times daily with a meal.  60 tablet  6  . digoxin (LANOXIN) 0.125 MG tablet TAKE 1 TABLET BY MOUTH DAILY  90 tablet  5  . furosemide (LASIX) 40 MG tablet Take 40 mg by mouth. Every Monday, Wednesday and Friday.      Marland Kitchen. lisinopril (PRINIVIL,ZESTRIL) 10 MG tablet Take 2 tablets (20 mg total) by mouth 2 (two) times daily.  120 tablet  3  . spironolactone (ALDACTONE) 25 MG tablet Take 1 tablet (25 mg total) by mouth daily.  30 tablet  6   No current facility-administered medications for this  encounter.   Filed Vitals:   07/08/13 0845  BP: 124/86  Pulse: 59  Resp: 18  Weight: 256 lb (116.121 kg)  SpO2: 98%    PHYSICAL EXAM: General:  Well appearing. No resp difficulty HEENT: normal Neck: supple. JVP flat. Carotids 2+ bilaterally; no bruits. No lymphadenopathy or thryomegaly appreciated. Cor: PMI normal. Regular rate & rhythm. No rubs, gallops or murmurs. Lungs: clear Abdomen: soft, nontender, nondistended. No hepatosplenomegaly. No bruits or masses. Good bowel sounds. Extremities: no cyanosis, clubbing, rash, edema Neuro: alert & orientedx3, cranial nerves grossly intact. Moves all 4 extremities w/o difficulty. Affect  pleasant.  ASSESSMENT AND PLAN  1. 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 Continue lasix 40 mg q Monday, Wednesday and Friday.  - On goal dose coreg 25 mg BID and lisinopril 20 mg BID. - Continue digoxin 0.125 mg daily and spironolactone 25 mg daily. - no nitrates d/t headaches - check labs with Guide-it today.  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 Since last visit he is doing more and trying to be more active. He still has some fatigue and mild SOB with strenuous activity, however think that most is from deconditioning. Encouraged to continue to try and do as much as he can and to call if he notices any functional decline. - CPX repeated and showed a low normal functional capacity when compared to matched sedentary norms. Will continue to follow. Will discuss with Dr. Gala Romney if it is ok for him to go back to work and start lifting more than 35 lbs. - Reinforced the need and importance of daily weights, a low sodium diet, and fluid restriction (less than 2 L a day). Instructed to call the HF clinic if weight increases more than 3 lbs overnight or 5 lbs in a week.  - EF remains less than 35%, however does not meet criteria for ICD implantation with NYHA I symptoms 2) Erectile Dysfunction - Provided prescription for Viagra 100 mg tablets. Discussed with patient that he could have headaches since he did not tolerate nitrates, however he is willing to try.  Follow up 4 months with MD Ulla Potash B NP-C 8:54 AM

## 2013-07-31 ENCOUNTER — Encounter: Payer: Self-pay | Admitting: Internal Medicine

## 2013-08-27 ENCOUNTER — Other Ambulatory Visit (HOSPITAL_COMMUNITY): Payer: Self-pay | Admitting: Internal Medicine

## 2013-09-14 ENCOUNTER — Other Ambulatory Visit (HOSPITAL_COMMUNITY): Payer: Self-pay | Admitting: Internal Medicine

## 2013-10-12 ENCOUNTER — Encounter (HOSPITAL_COMMUNITY): Payer: Self-pay

## 2013-10-12 ENCOUNTER — Ambulatory Visit (HOSPITAL_COMMUNITY)
Admission: RE | Admit: 2013-10-12 | Discharge: 2013-10-12 | Disposition: A | Discharge status: Home / Self Care | Payer: 59 | Source: Ambulatory Visit | Attending: Internal Medicine | Admitting: Internal Medicine

## 2013-10-12 VITALS — BP 114/76 | HR 67 | Wt 256.0 lb

## 2013-10-12 DIAGNOSIS — I509 Heart failure, unspecified: Secondary | ICD-10-CM | POA: Insufficient documentation

## 2013-10-12 DIAGNOSIS — I5022 Chronic systolic (congestive) heart failure: Secondary | ICD-10-CM | POA: Insufficient documentation

## 2013-10-12 NOTE — Patient Instructions (Signed)
We will contact you in 3 months to schedule your next appointment and echocardiogram  

## 2013-10-12 NOTE — Progress Notes (Signed)
Patient ID: Eric SeverinWilliam C Daniel, male   DOB: 1976-03-16, 38 y.o.   MRN: [id number]  PCP: Dr. Milinda AntisKawanta [facility])  HPI:  [name] is 38 y/o admitted with acute HF. Found to have EF 15% with restrictive diastolic parameters and mild to moderate RV dysfunction. 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 His father was just recently implanted with a HM II LVAD in February 2014.   [facility]12 with increased dyspnea on exertion. Pertinent admission labs included Pro BNP 3526, Troponin 0.43>0.39>0.42, creatinine 1.55, Hemoglobin 14.5, TSH 1.46, and potassium 4.9. UDS negative. He is also O+. Discharge weight 216 lbs   RA = 13  RV = 62/11/145  PA = 61/36 (47)  PCW = 28  Fick cardiac output/index = 5.0/2.2  Thermo CO/CI 5.8/2.5  SVR = 1292  PVR = 3.3 Woods  FA sat = 96%  PA sat = 64%, 67%   [facility] CPX 08/01/12  Peak VO2: 37.10  VE/VCO2 slope: 22.40  Peak RER: 1.17  CPX 10/23/12 Resting HR: 60 Peak HR: 166 (90% age predicted max HR) BP rest: 111/74 BP peak: 182/109 Peak VO2: 30.7 (85.7% predicted peak VO2) VE/VCO2 slope: 24.8 OUES: 3.50 Peak RER: 1.32  ECHO 09/17/12: EF 20-25%; Diffuse hypokinesis. There is severe hypokinesis of the entire inferior myocardium' moderately dilated.  ECHO 04/08/13: EF 15%; Diff HK, grade II DD, RV mod dilated  CPX 06/19/2013: Resting HR 64: Peak HR 168 (92% predicted max HR) BP rest 118/80 BP peak: 164/90 Peak VO2: 27.9 (82.4% predicted VO2) VE/VCO2 slope: 26 OUES: 3.24 Peak RER: 1.27 - Mild desaturation during exercise from 99% at rest to 92% peak exercise  Follow up: Feels very good. Having minimal fatigue with strenuous activity like pushing lawn mower. Walking about 2 miles a day with minimal fatigue. Denies SOB, PND, edema, orthopnea or CP. Taking lasix 3 times a week. Never needs extra. Weight at home 250 lbs. Following a low salt diet and drinking less than 2L a day. 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    09/17/12 Cr 1.29; BUN 17 10/22/12  Cr 1.5; BUN 16 12/14 Cr 1.42, BUN 15, K+ 4.2 05/2013: Cr 1.31, K+ 4.4, pro-BNP 433.7, dig level 0.7   FH: Father with nonischemic cardiomyopathy and LVAD  SH: No smoking or ETOH. 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   ROS: All systems negative except as listed in HPI, PMH and Problem List.  Past Medical History  Diagnosis Date  . NSVT (nonsustained ventricular tachycardia) 07/10/2012  . CHF (congestive heart failure) 06/2012    Current Outpatient Prescriptions  Medication Sig Dispense Refill  . carvedilol (COREG) 25 MG tablet TAKE 1 TABLET (25 MG TOTAL) BY MOUTH 2 (TWO) TIMES DAILY WITH A MEAL.  60 tablet  6  . digoxin (LANOXIN) 0.125 MG tablet TAKE 1 TABLET BY MOUTH DAILY  90 tablet  5  . furosemide (LASIX) 40 MG tablet Take 40 mg by mouth. Every Monday, Wednesday and Friday.      Marland Kitchen. lisinopril (PRINIVIL,ZESTRIL) 20 MG tablet Take 1 tablet (20 mg total) by mouth 2 (two) times daily.  180 tablet  3  . sildenafil (VIAGRA) 100 MG tablet Take 1 tablet (100 mg total) by mouth daily as needed for erectile dysfunction.  10 tablet  2  . spironolactone (ALDACTONE) 25 MG tablet TAKE 1 TABLET (25 MG TOTAL) BY MOUTH DAILY.  30 tablet  6   No current facility-administered medications for this encounter.   Filed Vitals:  10/12/13 1118  BP: 114/76  Pulse: 67  Weight: 256 lb (116.121 kg)  SpO2: 94%    PHYSICAL EXAM: General:  Well appearing. No resp difficulty HEENT: normal Neck: supple. JVP flat. Carotids 2+ bilaterally; no bruits. No lymphadenopathy or thryomegaly appreciated. Cor: PMI normal. Regular rate & rhythm. No rubs, gallops or murmurs. Lungs: clear Abdomen: soft, nontender, nondistended. No hepatosplenomegaly. No bruits or masses. Good bowel sounds. Extremities: no cyanosis, clubbing, rash, edema Neuro: alert & orientedx3, cranial nerves grossly intact. Moves all 4 extremities w/o difficulty. Affect pleasant.  ASSESSMENT AND PLAN  1. Chronic systolic HF: due to  NICM (suspect familial); EF 15% with diffuse hypokinesis, grade II DD (03/2013) - NYHA I - early II symptoms and volume status stable. Continue watchful waiting.  - Continue lasix 40 mg q Monday, Wednesday and Friday.  - On goal dose coreg 25 mg BID and lisinopril 20 mg BID. - Continue digoxin 0.125 mg daily and spironolactone 25 mg daily. - no nitrates d/t headaches and use of Viagra - check labs with Guide-it today.  - EF remains less than 35%, however does not meet criteria for ICD implantation with NYHA I symptoms - F/u echo at next visit  Follow up 3 months with echo /  [name] MD 11:28 AM

## 2013-10-12 NOTE — Addendum Note (Signed)
Encounter addended by: Noralee Space, RN on: 10/12/2013 11:39 AM<BR>     Documentation filed: Patient Instructions Section

## 2013-10-27 ENCOUNTER — Encounter: Payer: Self-pay | Admitting: Family Medicine

## 2013-10-27 ENCOUNTER — Ambulatory Visit (INDEPENDENT_AMBULATORY_CARE_PROVIDER_SITE_OTHER): Payer: 59 | Admitting: Family Medicine

## 2013-10-27 VITALS — BP 128/76 | HR 64 | Temp 98.0°F | Resp 12 | Ht 76.5 in | Wt 258.0 lb

## 2013-10-27 DIAGNOSIS — I5022 Chronic systolic (congestive) heart failure: Secondary | ICD-10-CM

## 2013-10-27 DIAGNOSIS — I428 Other cardiomyopathies: Secondary | ICD-10-CM

## 2013-10-27 DIAGNOSIS — I472 Ventricular tachycardia: Secondary | ICD-10-CM

## 2013-10-27 DIAGNOSIS — I4729 Other ventricular tachycardia: Secondary | ICD-10-CM

## 2013-10-27 DIAGNOSIS — Z0000 Encounter for general adult medical examination without abnormal findings: Secondary | ICD-10-CM

## 2013-10-27 DIAGNOSIS — Z13220 Encounter for screening for lipoid disorders: Secondary | ICD-10-CM

## 2013-10-27 LAB — CBC WITH DIFFERENTIAL/PLATELET
BASOS PCT: 0 % (ref 0–1)
Basophils Absolute: 0 10*3/uL (ref 0.0–0.1)
Eosinophils Absolute: 0.3 10*3/uL (ref 0.0–0.7)
Eosinophils Relative: 5 % (ref 0–5)
HCT: 38.4 % — ABNORMAL LOW (ref 39.0–52.0)
HEMOGLOBIN: 13.6 g/dL (ref 13.0–17.0)
Lymphocytes Relative: 28 % (ref 12–46)
Lymphs Abs: 1.5 10*3/uL (ref 0.7–4.0)
MCH: 29.6 pg (ref 26.0–34.0)
MCHC: 35.4 g/dL (ref 30.0–36.0)
MCV: 83.5 fL (ref 78.0–100.0)
MONOS PCT: 11 % (ref 3–12)
Monocytes Absolute: 0.6 10*3/uL (ref 0.1–1.0)
NEUTROS ABS: 3.1 10*3/uL (ref 1.7–7.7)
NEUTROS PCT: 56 % (ref 43–77)
PLATELETS: 177 10*3/uL (ref 150–400)
RBC: 4.6 MIL/uL (ref 4.22–5.81)
RDW: 13.7 % (ref 11.5–15.5)
WBC: 5.5 10*3/uL (ref 4.0–10.5)

## 2013-10-27 NOTE — Progress Notes (Signed)
Patient ID: Eric Daniel, male   DOB: [birth year], 38 y.o.   MRN: [id number]   Subjective:    Patient ID: Eric Daniel, male    DOB: April 19, 1975, 38 y.o.   MRN: [id number]  Patient presents for CPE  patient here to establish care and for new patient physical. He's been followed by cardiology Dr. been some unknown secondary to history of cardiomyopathy and NSVT. He was hospitalized a year ago after progressive shortness of breath he was found to have EF of 15% at that time. This is thought to be familial. He continues to followup cardiology a regular basis he is maintained on drug regimen. He declined having an ICD placed. He's also part of a heart failure study. His medications were reviewed. 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 He did request DOT physical but advised him that my office does not do these.  He has no history of alcohol abuse tobacco abuse. He has no history of any cancers. History of any lung problems hypertension or diabetes.  He did have renal insufficiency during the time of his cardiomyopathy workup this last couple of creatinines have been within normal limits with a GFR greater than 60 He has no specific concerns today.  Wears Contacts, visits dentist on regular basis Tetanus is up-to-date Note he has not used the Viagra which was suspended by cardiology states he was joking and did not know that they would send it in the skin removed from his medications Review Of Systems:  GEN- denies fatigue, fever, weight loss,weakness, recent illness HEENT- denies eye drainage, change in vision, nasal discharge, CVS- denies chest pain, palpitations RESP- denies SOB, cough, wheeze ABD- denies N/V, change in stools, abd pain GU- denies dysuria, hematuria, dribbling, incontinence MSK- denies joint pain, muscle aches, injury Neuro- denies headache, dizziness, syncope, seizure  activity       Objective:    BP 128/76  Pulse 64  Temp(Src) 98 F (36.7 C) (Oral)  Resp 12  Ht 6' 4.5" (1.943 m)  Wt 258 lb (117.028 kg)  BMI 31.00 kg/m2 GEN- NAD, alert and oriented x3 HEENT- PERRL, EOMI, non injected sclera, pink conjunctiva, MMM, oropharynx clear, TM Clear bilat  Neck- Supple, no thyromegaly CVS- RRR, no murmur RESP-CTAB ABD-NABS,soft,NT,ND EXT- No edema Psych- normal affect and mood Skin- in tact, multiple tatoos Pulses- Radial, DP- 2+        Assessment & Plan:      Problem List Items Addressed This Visit   Routine general medical examination at a health care facility - Primary   Relevant Orders      CBC with Differential      Comprehensive metabolic panel   Nonischemic cardiomyopathy ? familial-restrictive   Relevant Orders      TSH   Chronic systolic heart failure    Other Visit Diagnoses   Screening, lipid        Relevant Orders       Lipid panel       Note: This dictation was prepared with Dragon dictation along with smaller phrase technology. Any transcriptional errors that result from this process are unintentional.

## 2013-10-27 NOTE — Patient Instructions (Signed)
Continue current medications We will call with lab results  F/U as needed in 6 month

## 2013-10-27 NOTE — Assessment & Plan Note (Signed)
New patient physical exam. His tetanus is up-to-date his fasting labs are done today. He will require recommended screening at recommended age such as PSA testing and colonoscopy

## 2013-10-27 NOTE — Assessment & Plan Note (Signed)
Cardiology note reviewed. He is in heart failure study. He is followup echoes every 3-6 months.

## 2013-10-27 NOTE — Assessment & Plan Note (Signed)
Maintained on diuretic therapy of Lasix as needed and Aldactone daily

## 2013-10-28 LAB — COMPREHENSIVE METABOLIC PANEL
ALBUMIN: 4.3 g/dL (ref 3.5–5.2)
ALK PHOS: 45 U/L (ref 39–117)
ALT: 18 U/L (ref 0–53)
AST: 18 U/L (ref 0–37)
BILIRUBIN TOTAL: 0.9 mg/dL (ref 0.2–1.2)
BUN: 16 mg/dL (ref 6–23)
CO2: 28 mEq/L (ref 19–32)
Calcium: 9.5 mg/dL (ref 8.4–10.5)
Chloride: 100 mEq/L (ref 96–112)
Creat: 1.36 mg/dL — ABNORMAL HIGH (ref 0.50–1.35)
GLUCOSE: 90 mg/dL (ref 70–99)
Potassium: 4.2 mEq/L (ref 3.5–5.3)
SODIUM: 136 meq/L (ref 135–145)
Total Protein: 6.8 g/dL (ref 6.0–8.3)

## 2013-10-28 LAB — LIPID PANEL
Cholesterol: 309 mg/dL — ABNORMAL HIGH (ref 0–200)
HDL: 29 mg/dL — ABNORMAL LOW (ref >39)
TRIGLYCERIDES: 1186 mg/dL — AB (ref <150)
Total CHOL/HDL Ratio: 10.7 Ratio

## 2013-10-28 LAB — TSH: TSH: 1.344 u[IU]/mL (ref 0.350–4.500)

## 2013-10-28 MED ORDER — GEMFIBROZIL 600 MG PO TABS: 600.0000 mg | ORAL_TABLET | Freq: Two times a day (BID) | ORAL | Status: DC

## 2013-10-28 NOTE — Addendum Note (Signed)
Addended by: Milinda Antis F on: 10/28/2013 10:42 PM   Modules accepted: Orders

## 2013-10-29 ENCOUNTER — Encounter: Payer: Self-pay | Admitting: Internal Medicine

## 2013-10-29 ENCOUNTER — Other Ambulatory Visit: Payer: Self-pay | Admitting: *Deleted

## 2013-10-29 DIAGNOSIS — E785 Hyperlipidemia, unspecified: Secondary | ICD-10-CM

## 2013-11-02 ENCOUNTER — Encounter (HOSPITAL_COMMUNITY): Payer: 59

## 2013-11-06 ENCOUNTER — Encounter (HOSPITAL_COMMUNITY): Payer: 59

## 2013-12-10 ENCOUNTER — Ambulatory Visit (INDEPENDENT_AMBULATORY_CARE_PROVIDER_SITE_OTHER): Payer: Self-pay | Admitting: Family Medicine

## 2013-12-10 VITALS — BP 124/80 | HR 60 | Temp 97.6°F | Resp 16 | Ht 75.25 in | Wt 254.0 lb

## 2013-12-10 DIAGNOSIS — Z0000 Encounter for general adult medical examination without abnormal findings: Secondary | ICD-10-CM

## 2013-12-10 DIAGNOSIS — Z0289 Encounter for other administrative examinations: Secondary | ICD-10-CM

## 2013-12-10 NOTE — Progress Notes (Signed)
Patient ID: SEABORN CARROLL MRN: [id number], DOB: 1975/06/09 37 y.o. Date of Encounter: 12/10/2013, 2:32 PM  Primary Physician: Milinda Antis, MD  Chief Complaint: Physical (CPE)  HPI: 38 y.o. y/o male with history noted below here for CPE.  Doing well. No issues/complaints.  Review of Systems: Consitutional: No fever, chills, fatigue, night sweats, lymphadenopathy, or weight changes. Eyes: No visual changes, eye redness, or discharge. ENT/Mouth: Ears: No otalgia, tinnitus, hearing loss, discharge. Nose: No congestion, rhinorrhea, sinus pain, or epistaxis. Throat: No sore throat, post nasal drip, or teeth pain. Cardiovascular: No CP, palpitations, diaphoresis, DOE, edema, orthopnea, PND. Respiratory: No cough, hemoptysis, SOB, or wheezing. Gastrointestinal: No anorexia, dysphagia, reflux, pain, nausea, vomiting, hematemesis, diarrhea, constipation, BRBPR, or melena. Genitourinary: No dysuria, frequency, urgency, hematuria, incontinence, nocturia, decreased urinary stream, discharge, impotence, or testicular pain/masses. Musculoskeletal: No decreased ROM, myalgias, stiffness, joint swelling, or weakness. Skin: No rash, erythema, lesion changes, pain, warmth, jaundice, or pruritis. Neurological: No headache, dizziness, syncope, seizures, tremors, memory loss, coordination problems, or paresthesias. Psychological: No anxiety, depression, hallucinations, SI/HI. Endocrine: No fatigue, polydipsia, polyphagia, polyuria, or known diabetes. All other systems were reviewed and are otherwise negative.  Past Medical History  Diagnosis Date  . NSVT (nonsustained ventricular tachycardia) 07/10/2012  . CHF (congestive heart failure) 06/2012     History reviewed. No pertinent past surgical history.  Home Meds:  Prior to Admission medications   Medication Sig Start Date End Date Taking? Authorizing Provider  carvedilol (COREG) 25 MG tablet TAKE 1 TABLET (25 MG TOTAL) BY MOUTH 2 (TWO) TIMES DAILY  WITH A MEAL. 09/14/13  Yes Dolores Patty, MD  digoxin (LANOXIN) 0.125 MG tablet TAKE 1 TABLET BY MOUTH DAILY   Yes Bevelyn Buckles Bensimhon, MD  furosemide (LASIX) 40 MG tablet Take 40 mg by mouth. Every Monday, Wednesday and Friday. 02/19/13  Yes Aundria Rud, NP  gemfibrozil (LOPID) 600 MG tablet Take 1 tablet (600 mg total) by mouth 2 (two) times daily before a meal. 10/28/13  Yes Salley Scarlet, MD  lisinopril (PRINIVIL,ZESTRIL) 20 MG tablet Take 1 tablet (20 mg total) by mouth 2 (two) times daily. 07/08/13  Yes Aundria Rud, NP  spironolactone (ALDACTONE) 25 MG tablet TAKE 1 TABLET (25 MG TOTAL) BY MOUTH DAILY.   Yes Dolores Patty, MD    Allergies: No Known Allergies  History   Social History  . Marital Status: Married    Spouse Name: N/A    Number of Children: N/A  . Years of Education: N/A   Occupational History  . Not on file.   Social History Main Topics  . Smoking status: Never Smoker   . Smokeless tobacco: Never Used  . Alcohol Use: No  . Drug Use: No  . Sexual Activity: Yes   Other Topics Concern  . Not on file   Social History Narrative  . No narrative on file    Family History  Problem Relation Age of Onset  . Diabetes Mother   . Kidney disease Mother   . Heart Problems Father   . Heart disease Father   . Autism Brother     Physical Exam: Blood pressure 124/80, pulse 60, temperature 97.6 F (36.4 C), temperature source Oral, resp. rate 16, height 6' 3.25" (1.911 m), weight 254 lb (115.214 kg), SpO2 97.00%.  BP Readings from Last 3 Encounters:  12/10/13 124/80  10/27/13 128/76  10/12/13 114/76   General: Well developed, well nourished, in no acute distress. HEENT: Normocephalic, atraumatic.  Conjunctiva pink, sclera non-icteric. Pupils 2 mm constricting to 1 mm, round, regular, and equally reactive to light and accomodation. EOMI. Internal auditory canal clear. TMs with good cone of light and without pathology. Nasal mucosa pink. Nares are without  discharge. No sinus tenderness. Oral mucosa pink. Dentition good. Pharynx without exudate.     Visual Acuity  Right Eye Distance:   Left Eye Distance:   Bilateral Distance:    Right Eye Near:   Left Eye Near:    Bilateral Near:      Neck: Supple. Trachea midline. No thyromegaly. Full ROM. No lymphadenopathy. Lungs: Clear to auscultation bilaterally without wheezes, rales, or rhonchi. Breathing is of normal effort and unlabored. Cardiovascular: RRR with S1 S2. No murmurs, rubs, or gallops appreciated. Distal pulses 2+ symmetrically. No carotid or abdominal bruits Abdomen: Soft, non-tender, non-distended with normoactive bowel sounds. No hepatosplenomegaly or masses. No rebound/guarding. No CVA tenderness. Without hernias.   Genitourinary:  circumcised male. No penile lesions. Testes descended bilaterally, and smooth without tenderness or masses.  Musculoskeletal: Full range of motion and 5/5 strength throughout. Without swelling, atrophy, tenderness, crepitus, or warmth. Extremities without clubbing, cyanosis, or edema. Calves supple. Skin: Warm and moist without erythema, ecchymosis, wounds, or rash. Neuro: A+Ox3. CN II-XII grossly intact. Moves all extremities spontaneously. Full sensation throughout. Normal gait. DTR 2+ throughout upper and lower extremities. Finger to nose intact. Psych:  Responds to questions appropriately with a normal affect.   Lab Results  Component Value Date   CHOL 309* 10/27/2013   Lab Results  Component Value Date   HDL 29* 10/27/2013   Lab Results  Component Value Date   LDLCALC NOT CALC 10/27/2013   Lab Results  Component Value Date   TRIG 1186* 10/27/2013   Lab Results  Component Value Date   CHOLHDL 10.7 10/27/2013   No results found for this basename: LDLDIRECT    Assessment/Plan:  38 y.o. y/o healthy, hypertensive male here for CPE -Follow up with PCP  Signed, Elvina Sidle, MD 12/10/2013 2:32 PM

## 2013-12-10 NOTE — Patient Instructions (Signed)
Results for orders placed in visit on 10/27/13  CBC WITH DIFFERENTIAL      Result Value Ref Range   WBC 5.5  4.0 - 10.5 K/uL   RBC 4.60  4.22 - 5.81 MIL/uL   Hemoglobin 13.6  13.0 - 17.0 g/dL   HCT 38.4 (*) 39.0 - 52.0 %   MCV 83.5  78.0 - 100.0 fL   MCH 29.6  26.0 - 34.0 pg   MCHC 35.4  30.0 - 36.0 g/dL   RDW 13.7  11.5 - 15.5 %   Platelets 177  150 - 400 K/uL   Neutrophils Relative % 56  43 - 77 %   Neutro Abs 3.1  1.7 - 7.7 K/uL   Lymphocytes Relative 28  12 - 46 %   Lymphs Abs 1.5  0.7 - 4.0 K/uL   Monocytes Relative 11  3 - 12 %   Monocytes Absolute 0.6  0.1 - 1.0 K/uL   Eosinophils Relative 5  0 - 5 %   Eosinophils Absolute 0.3  0.0 - 0.7 K/uL   Basophils Relative 0  0 - 1 %   Basophils Absolute 0.0  0.0 - 0.1 K/uL   Smear Review Criteria for review not met    COMPREHENSIVE METABOLIC PANEL      Result Value Ref Range   Sodium 136  135 - 145 mEq/L   Potassium 4.2  3.5 - 5.3 mEq/L   Chloride 100  96 - 112 mEq/L   CO2 28  19 - 32 mEq/L   Glucose, Bld 90  70 - 99 mg/dL   BUN 16  6 - 23 mg/dL   Creat 1.36 (*) 0.50 - 1.35 mg/dL   Total Bilirubin 0.9  0.2 - 1.2 mg/dL   Alkaline Phosphatase 45  39 - 117 U/L   AST 18  0 - 37 U/L   ALT 18  0 - 53 U/L   Total Protein 6.8  6.0 - 8.3 g/dL   Albumin 4.3  3.5 - 5.2 g/dL   Calcium 9.5  8.4 - 10.5 mg/dL  LIPID PANEL      Result Value Ref Range   Cholesterol 309 (*) 0 - 200 mg/dL   Triglycerides 1186 (*) <150 mg/dL   HDL 29 (*) >39 mg/dL   Total CHOL/HDL Ratio 10.7     VLDL NOT CALC  0 - 40 mg/dL   LDL Cholesterol NOT CALC  0 - 99 mg/dL  TSH      Result Value Ref Range   TSH 1.344  0.350 - 4.500 uIU/mL   Health Maintenance A healthy lifestyle and preventative care can promote health and wellness.  Maintain regular health, dental, and eye exams.  Eat a healthy diet. Foods like vegetables, fruits, whole grains, low-fat dairy products, and lean protein foods contain the nutrients you need and are low in calories. Decrease  your intake of foods high in solid fats, added sugars, and salt. Get information about a proper diet from your health care provider, if necessary.  Regular physical exercise is one of the most important things you can do for your health. Most adults should get at least 150 minutes of moderate-intensity exercise (any activity that increases your heart rate and causes you to sweat) each week. In addition, most adults need muscle-strengthening exercises on 2 or more days a week.   Maintain a healthy weight. The body mass index (BMI) is a screening tool to identify possible weight problems. It provides an estimate of  body fat based on height and weight. Your health care provider can find your BMI and can help you achieve or maintain a healthy weight. For males 20 years and older:  A BMI below 18.5 is considered underweight.  A BMI of 18.5 to 24.9 is normal.  A BMI of 25 to 29.9 is considered overweight.  A BMI of 30 and above is considered obese.  Maintain normal blood lipids and cholesterol by exercising and minimizing your intake of saturated fat. Eat a balanced diet with plenty of fruits and vegetables. Blood tests for lipids and cholesterol should begin at age 48 and be repeated every 5 years. If your lipid or cholesterol levels are high, you are over age 79, or you are at high risk for heart disease, you may need your cholesterol levels checked more frequently.Ongoing high lipid and cholesterol levels should be treated with medicines if diet and exercise are not working.  If you smoke, find out from your health care provider how to quit. If you do not use tobacco, do not start.  Lung cancer screening is recommended for adults aged 39-80 years who are at high risk for developing lung cancer because of a history of smoking. A yearly low-dose CT scan of the lungs is recommended for people who have at least a 30-pack-year history of smoking and are current smokers or have quit within the past 15  years. A pack year of smoking is smoking an average of 1 pack of cigarettes a day for 1 year (for example, a 30-pack-year history of smoking could mean smoking 1 pack a day for 30 years or 2 packs a day for 15 years). Yearly screening should continue until the smoker has stopped smoking for at least 15 years. Yearly screening should be stopped for people who develop a health problem that would prevent them from having lung cancer treatment.  If you choose to drink alcohol, do not have more than 2 drinks per day. One drink is considered to be 12 oz (360 mL) of beer, 5 oz (150 mL) of wine, or 1.5 oz (45 mL) of liquor.  Avoid the use of street drugs. Do not share needles with anyone. Ask for help if you need support or instructions about stopping the use of drugs.  High blood pressure causes heart disease and increases the risk of stroke. Blood pressure should be checked at least every 1-2 years. Ongoing high blood pressure should be treated with medicines if weight loss and exercise are not effective.  If you are 38-9 years old, ask your health care provider if you should take aspirin to prevent heart disease.  Diabetes screening involves taking a blood sample to check your fasting blood sugar level. This should be done once every 3 years after age 80 if you are at a normal weight and without risk factors for diabetes. Testing should be considered at a younger age or be carried out more frequently if you are overweight and have at least 1 risk factor for diabetes.  Colorectal cancer can be detected and often prevented. Most routine colorectal cancer screening begins at the age of 17 and continues through age 80. However, your health care provider may recommend screening at an earlier age if you have risk factors for colon cancer. On a yearly basis, your health care provider may provide home test kits to check for hidden blood in the stool. A small camera at the end of a tube may be used to directly  examine  the colon (sigmoidoscopy or colonoscopy) to detect the earliest forms of colorectal cancer. Talk to your health care provider about this at age 79 when routine screening begins. A direct exam of the colon should be repeated every 5-10 years through age 34, unless early forms of precancerous polyps or small growths are found.  People who are at an increased risk for hepatitis B should be screened for this virus. You are considered at high risk for hepatitis B if:  You were born in a country where hepatitis B occurs often. Talk with your health care provider about which countries are considered high risk.  Your parents were born in a high-risk country and you have not received a shot to protect against hepatitis B (hepatitis B vaccine).  You have HIV or AIDS.  You use needles to inject street drugs.  You live with, or have sex with, someone who has hepatitis B.  You are a man who has sex with other men (MSM).  You get hemodialysis treatment.  You take certain medicines for conditions like cancer, organ transplantation, and autoimmune conditions.  Hepatitis C blood testing is recommended for all people born from [birth year] through 1965 and any individual with known risk factors for hepatitis C.  Healthy men should no longer receive prostate-specific antigen (PSA) blood tests as part of routine cancer screening. Talk to your health care provider about prostate cancer screening.  Testicular cancer screening is not recommended for adolescents or adult males who have no symptoms. Screening includes self-exam, a health care provider exam, and other screening tests. Consult with your health care provider about any symptoms you have or any concerns you have about testicular cancer.  Practice safe sex. Use condoms and avoid high-risk sexual practices to reduce the spread of sexually transmitted infections (STIs).  You should be screened for STIs, including gonorrhea and chlamydia if:  You are  sexually active and are younger than 24 years.  You are older than 24 years, and your health care provider tells you that you are at risk for this type of infection.  Your sexual activity has changed since you were last screened, and you are at an increased risk for chlamydia or gonorrhea. Ask your health care provider if you are at risk.  If you are at risk of being infected with HIV, it is recommended that you take a prescription medicine daily to prevent HIV infection. This is called pre-exposure prophylaxis (PrEP). You are considered at risk if:  You are a man who has sex with other men (MSM).  You are a heterosexual man who is sexually active with multiple partners.  You take drugs by injection.  You are sexually active with a partner who has HIV.  Talk with your health care provider about whether you are at high risk of being infected with HIV. If you choose to begin PrEP, you should first be tested for HIV. You should then be tested every 3 months for as long as you are taking PrEP.  Use sunscreen. Apply sunscreen liberally and repeatedly throughout the day. You should seek shade when your shadow is shorter than you. Protect yourself by wearing long sleeves, pants, a wide-brimmed hat, and sunglasses year round whenever you are outdoors.  Tell your health care provider of new moles or changes in moles, especially if there is a change in shape or color. Also, tell your health care provider if a mole is larger than the size of a pencil eraser.  A one-time screening  for abdominal aortic aneurysm (AAA) and surgical repair of large AAAs by ultrasound is recommended for men aged 45-75 years who are current or former smokers.  Stay current with your vaccines (immunizations). Document Released: 09/29/2007 Document Revised: 04/07/2013 Document Reviewed: 08/28/2010 [facility] Patient Information 2015 [place], [place]. This information is not intended to replace advice given to you by your health  care provider. Make sure you discuss any questions you have with your health care provider.

## 2013-12-16 ENCOUNTER — Emergency Department (HOSPITAL_COMMUNITY)
Admission: EM | Admit: 2013-12-16 | Discharge: 2013-12-16 | Disposition: A | Discharge status: Home / Self Care | Payer: 59 | Source: Home / Self Care | Attending: Family Medicine | Admitting: Family Medicine

## 2013-12-16 ENCOUNTER — Encounter (HOSPITAL_COMMUNITY): Payer: Self-pay | Admitting: Family Medicine

## 2013-12-16 DIAGNOSIS — I5022 Chronic systolic (congestive) heart failure: Secondary | ICD-10-CM

## 2013-12-16 DIAGNOSIS — R509 Fever, unspecified: Secondary | ICD-10-CM

## 2013-12-16 LAB — POCT RAPID STREP A: Streptococcus, Group A Screen (Direct): NEGATIVE

## 2013-12-16 MED ORDER — FLUTICASONE PROPIONATE 50 MCG/ACT NA SUSP: 1.0000 | Freq: Every day | NASAL | Status: DC

## 2013-12-16 MED ORDER — PENICILLIN G BENZATHINE 1200000 UNIT/2ML IM SUSP
INTRAMUSCULAR | Status: AC
  Filled 2013-12-16: qty 2

## 2013-12-16 MED ORDER — GUAIFENESIN-CODEINE 100-10 MG/5ML PO SOLN
5.0000 mL | Freq: Three times a day (TID) | ORAL | Status: DC | PRN
Start: 2013-12-16 — End: 2014-01-20

## 2013-12-16 MED ORDER — PENICILLIN G BENZATHINE 1200000 UNIT/2ML IM SUSP
1.2000 10*6.[IU] | Freq: Once | INTRAMUSCULAR | Status: AC
  Administered 2013-12-16: 1.2 10*6.[IU] via INTRAMUSCULAR

## 2013-12-16 MED ORDER — IPRATROPIUM BROMIDE 0.06 % NA SOLN: 2.0000 | Freq: Four times a day (QID) | NASAL | Status: DC

## 2013-12-16 NOTE — ED Provider Notes (Signed)
CSN: [id number]     Arrival date & time 12/16/13  1859 History   First MD Initiated Contact with Patient 12/16/13 1920     Chief Complaint  Patient presents with  . URI   (Consider location/radiation/quality/duration/timing/severity/associated sxs/prior Treatment) HPI  Started feeling poorly 2 days ago. Started as sore throat. Then developed cough and runny nose last night. Mucinex w/o much benefit. Chills adn cold sweats. Denies CP, SOB, palpitations, nausea, vomiting, fevers, diarrhea, rash, LE swelling. Able to sleep lying flat.    Past Medical History  Diagnosis Date  . NSVT (nonsustained ventricular tachycardia) 07/10/2012  . CHF (congestive heart failure) 06/2012   History reviewed. No pertinent past surgical history. Family History  Problem Relation Age of Onset  . Diabetes Mother   . Kidney disease Mother   . Heart Problems Father   . Heart disease Father   . Autism Brother    History  Substance Use Topics  . Smoking status: Never Smoker   . Smokeless tobacco: Never Used  . Alcohol Use: No    Review of Systems Per HPI with all other pertinent systems negative.   Allergies  Review of patient's allergies indicates no known allergies.  Home Medications   Prior to Admission medications   Medication Sig Start Date End Date Taking? Authorizing Provider  carvedilol (COREG) 25 MG tablet TAKE 1 TABLET (25 MG TOTAL) BY MOUTH 2 (TWO) TIMES DAILY WITH A MEAL. 09/14/13   Dolores Patty, MD  digoxin (LANOXIN) 0.125 MG tablet TAKE 1 TABLET BY MOUTH DAILY    Dolores Patty, MD  fluticasone (FLONASE) 50 MCG/ACT nasal spray Place 1-2 sprays into both nostrils daily. 12/16/13   Ozella Rocks, MD  furosemide (LASIX) 40 MG tablet Take 40 mg by mouth. Every Monday, Wednesday and Friday. 02/19/13   Aundria Rud, NP  gemfibrozil (LOPID) 600 MG tablet Take 1 tablet (600 mg total) by mouth 2 (two) times daily before a meal. 10/28/13   Salley Scarlet, MD  guaiFENesin-codeine 100-10  MG/5ML syrup Take 5-10 mLs by mouth 3 (three) times daily as needed for cough. 12/16/13   Ozella Rocks, MD  ipratropium (ATROVENT) 0.06 % nasal spray Place 2 sprays into both nostrils 4 (four) times daily. 12/16/13   Ozella Rocks, MD  lisinopril (PRINIVIL,ZESTRIL) 20 MG tablet Take 1 tablet (20 mg total) by mouth 2 (two) times daily. 07/08/13   Aundria Rud, NP  spironolactone (ALDACTONE) 25 MG tablet TAKE 1 TABLET (25 MG TOTAL) BY MOUTH DAILY.    Bevelyn Buckles Bensimhon, MD   BP 126/72  Pulse 79  Temp(Src) 98.3 F (36.8 C) (Oral)  Resp 20  SpO2 98% Physical Exam  Constitutional: He is oriented to person, place, and time. He appears well-developed and well-nourished. No distress.  HENT:  Head: Normocephalic and atraumatic.  PHarynx injected, tonsils 0 and w/o exudate, cervical lymphadenopathy bilat  Eyes: EOM are normal. Pupils are equal, round, and reactive to light.  Cardiovascular: Normal rate and normal heart sounds.   II/VI systolic murmur  Pulmonary/Chest: Effort normal and breath sounds normal. No respiratory distress. He has no wheezes. He has no rales. He exhibits no tenderness.  Abdominal: Soft. Bowel sounds are normal.  Musculoskeletal: Normal range of motion. He exhibits no edema and no tenderness.  Neurological: He is alert and oriented to person, place, and time.  Skin: Skin is warm. No rash noted. He is not diaphoretic. No erythema. No pallor.  Psychiatric: He has  a normal mood and affect. His behavior is normal. Judgment and thought content normal.    ED Course  Procedures (including critical care time) Labs Review Labs Reviewed - No data to display  Imaging Review No results found.   MDM   1. Febrile illness   2. Chronic systolic heart failure    Strep throat is the most likely etiology but cannot r/o viral illness (Flu???). Pt just outside 48 hr window for treatment Rapid strep today Favor treatment based on symptoms and pts sCHF Bicillin 1.8million  units Flonase, nasal atrovent, nasal saline, Robitussin AC PRN relief  sCHF: no sign of exacerbation as wt stable, no SOB or orthopnea or CP. Continue current regimen. Look for s/s of fluid overload adn f/u Dr. Leonard Schwartz ASAP if needed  Precautions given and all questions answered   Shelly Flatten, MD Family Medicine 12/16/2013, 7:45 PM      Ozella Rocks, MD 12/16/13 1945

## 2013-12-16 NOTE — ED Notes (Signed)
C/o sore throat onset Tues, then got cough, sneezing, body aches and runny nose.  C/o headache after throat was swabbed and he gagged.  No fever noted.

## 2013-12-16 NOTE — Discharge Instructions (Signed)
You are likely suffering from strep throat You may have a viral illness such as the flu, but it's unlikely You were given antibiotics to clear the infection Please use tylenol for pain and fever Please use the flonase at night to help with the nasal swelling and congestion Use the atrovent during the day to help with the runny nose Please also use nasal saline to keep your nasal passages moist Please call or come back if you are not better.

## 2013-12-16 NOTE — ED Notes (Signed)
Instructed he has to wait 15 min. after injection and to tell us if he has any signs of allergic reaction.

## 2013-12-18 LAB — CULTURE, GROUP A STREP

## 2014-01-12 ENCOUNTER — Telehealth: Payer: Self-pay | Admitting: Internal Medicine

## 2014-01-12 NOTE — Telephone Encounter (Signed)
[employer] paperwork rec, Sent Inter[facility] to [facility]/Dr.Bensimhon  9.29.15/km

## 2014-01-20 ENCOUNTER — Ambulatory Visit (HOSPITAL_BASED_OUTPATIENT_CLINIC_OR_DEPARTMENT_OTHER)
Admission: RE | Admit: 2014-01-20 | Discharge: 2014-01-20 | Disposition: A | Discharge status: Home / Self Care | Payer: 59 | Source: Ambulatory Visit | Attending: Cardiology | Admitting: Cardiology

## 2014-01-20 ENCOUNTER — Ambulatory Visit (HOSPITAL_COMMUNITY)
Admission: RE | Admit: 2014-01-20 | Discharge: 2014-01-20 | Disposition: A | Discharge status: Home / Self Care | Payer: 59 | Source: Ambulatory Visit | Attending: Cardiology | Admitting: Cardiology

## 2014-01-20 VITALS — BP 116/78 | HR 107 | Wt 256.5 lb

## 2014-01-20 DIAGNOSIS — Z79899 Other long term (current) drug therapy: Secondary | ICD-10-CM | POA: Diagnosis not present

## 2014-01-20 DIAGNOSIS — I5022 Chronic systolic (congestive) heart failure: Secondary | ICD-10-CM | POA: Diagnosis not present

## 2014-01-20 DIAGNOSIS — I509 Heart failure, unspecified: Secondary | ICD-10-CM | POA: Diagnosis present

## 2014-01-20 MED ORDER — SACUBITRIL-VALSARTAN 49-51 MG PO TABS: 1.0000 | ORAL_TABLET | Freq: Two times a day (BID) | ORAL | Status: DC

## 2014-01-20 NOTE — Patient Instructions (Addendum)
Stop Lisinopril  Start Entresto 49/51 mg Twice daily starting on Friday AM  Labs in 10 days (bmet)  Your physician recommends that you schedule a follow-up appointment in: 3 months

## 2014-01-20 NOTE — Progress Notes (Signed)
Patient ID: Eric Daniel, male   DOB: 01-19-1976, 38 y.o.   MRN: [id number] PCP: Dr. Milinda Antis [facility])  HPI:  Eric Daniel is 38 y/o admitted with acute HF. Found to have EF 15% with restrictive diastolic parameters and mild to moderate RV dysfunction. 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 Eric Daniel father was implanted with a HM II LVAD in February 2014.   Eric Daniel was admitted to [facility] 07/06/12 with increased dyspnea on exertion. Pertinent admission labs included Pro BNP 3526, Troponin 0.43>0.39>0.42, creatinine 1.55, Hemoglobin 14.5, TSH 1.46, and potassium 4.9. UDS negative. Eric Daniel is also O+. Discharge weight 216 lbs   RA = 13  RV = 62/11/145  PA = 61/36 (47)  PCW = 28  Fick cardiac output/index = 5.0/2.2  Thermo CO/CI 5.8/2.5  SVR = 1292  PVR = 3.3 Woods  FA sat = 96%  PA sat = 64%, 67%   [facility] CPX 08/01/12  Peak VO2: 37.10  VE/VCO2 slope: 22.40  Peak RER: 1.17  CPX 10/23/12 Resting HR: 60 Peak HR: 166 (90% age predicted max HR) BP rest: 111/74 BP peak: 182/109 Peak VO2: 30.7 (85.7% predicted peak VO2) VE/VCO2 slope: 24.8 OUES: 3.50 Peak RER: 1.32  ECHO 09/17/12: EF 20-25%; Diffuse hypokinesis. There is severe hypokinesis of the entire inferior myocardium' moderately dilated.  ECHO 04/08/13: EF 15%; Diff HK, grade II DD, RV mod dilated  CPX 06/19/2013: Resting HR 64: Peak HR 168 (92% predicted max HR) BP rest 118/80 BP peak: 164/90 Peak VO2: 27.9 (82.4% predicted VO2) VE/VCO2 slope: 26 OUES: 3.24 Peak RER: 1.27 - Mild desaturation during exercise from 99% at rest to 92% peak exercise  Follow up:  Patient seems to have had at least a mild functional decline over the last year or so.  Eric Daniel still does some light jogging and uses a treadmill but notes some fatigue with moderate activity. Eric Daniel is ok with walking and going up steps, etc.  Weight is stable.  Eric Daniel is tolerating Eric Daniel meds. 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   I reviewed today's echo.  EF 20% with severe LV dilation.  The RV appeared  normal in size and systolic function.   ECG: NSR, inferior Qs, poor RWP   Labs:  09/17/12 Cr 1.29; BUN 17 10/22/12 Cr 1.5; BUN 16 12/14 Cr 1.42, BUN 15, K+ 4.2 05/2013: Cr 1.31, K+ 4.4, pro-BNP 433.7, dig level 0.7  7/15: K 4.2, creatinine 1.36, TGs 1186  FH: Father with nonischemic cardiomyopathy and LVAD  SH: No smoking or ETOH. 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   ROS: All systems negative except as listed in HPI, PMH and Problem List.  Past Medical History  Diagnosis Date  . NSVT (nonsustained ventricular tachycardia) 07/10/2012  . CHF (congestive heart failure) 06/2012    Current Outpatient Prescriptions  Medication Sig Dispense Refill  . carvedilol (COREG) 25 MG tablet TAKE 1 TABLET (25 MG TOTAL) BY MOUTH 2 (TWO) TIMES DAILY WITH A MEAL.  60 tablet  6  . digoxin (LANOXIN) 0.125 MG tablet TAKE 1 TABLET BY MOUTH DAILY  90 tablet  5  . fluticasone (FLONASE) 50 MCG/ACT nasal spray Place 1-2 sprays into both nostrils daily.  16 g  2  . furosemide (LASIX) 40 MG tablet Take 40 mg by mouth. Every Monday, Wednesday and Friday.      Marland Kitchen gemfibrozil (LOPID) 600 MG tablet Take 1 tablet (600 mg total) by mouth 2 (two) times daily before a meal.  60 tablet  6  .  ipratropium (ATROVENT) 0.06 % nasal spray Place 2 sprays into both nostrils 4 (four) times daily.  15 mL  12  . spironolactone (ALDACTONE) 25 MG tablet TAKE 1 TABLET (25 MG TOTAL) BY MOUTH DAILY.  30 tablet  6  . Sacubitril-Valsartan (ENTRESTO) 49-51 MG TABS Take 1 tablet by mouth 2 (two) times daily.  60 tablet  6   No current facility-administered medications for this encounter.   Filed Vitals:   01/20/14 1201  BP: 116/78  Pulse: 107  Weight: 256 lb 8 oz (116.348 kg)  SpO2: 97%    PHYSICAL EXAM: General:  Well appearing. No resp difficulty HEENT: normal Neck: supple. JVP flat. Carotids 2+ bilaterally; no bruits. No lymphadenopathy or thryomegaly appreciated. Cor: PMI normal. Regular rate & rhythm. No rubs,  gallops or murmurs. Lungs: clear Abdomen: soft, nontender, nondistended. No hepatosplenomegaly. No bruits or masses. Good bowel sounds. Extremities: no cyanosis, clubbing, rash, edema Neuro: alert & orientedx3, cranial nerves grossly intact. Moves all 4 extremities w/o difficulty. Affect pleasant.  ASSESSMENT AND PLAN  1. 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  Suspect early NYHA class II symptoms => peak VO2 dropped by 10 in the last year though it is still relatively good.  Eric Daniel is not volume overloaded.   - Continue lasix 40 mg q Monday, Wednesday and Friday.  - On goal dose coreg 25 mg BID  - Continue digoxin 0.125 mg daily and spironolactone 25 mg daily. - I am going to have him stop lisinopril and 36 hrs later start Entresto 49/51 bid.  BMET in 10 days.  - I think it would be reasonable to consider ICD at this point.  I am going to talk to Dr. Graciela HusbandsKlein about this. Eric Daniel is not a CRT candidate.   Marca Anconaalton McLean MD 01/20/2014

## 2014-01-20 NOTE — Progress Notes (Signed)
  Echocardiogram 2D Echocardiogram has been performed.  Leta Jungling M 01/20/2014, 11:59 AM

## 2014-01-21 NOTE — Addendum Note (Signed)
Encounter addended by: Deitra Mayo, CCT on: 01/21/2014 11:53 AM<BR>     Documentation filed: Charges VN

## 2014-01-22 ENCOUNTER — Encounter: Payer: Self-pay | Admitting: Internal Medicine

## 2014-01-26 ENCOUNTER — Other Ambulatory Visit (HOSPITAL_COMMUNITY): Payer: 59

## 2014-01-29 ENCOUNTER — Other Ambulatory Visit: Payer: Self-pay

## 2014-02-02 ENCOUNTER — Inpatient Hospital Stay (HOSPITAL_COMMUNITY): Admission: RE | Admit: 2014-02-02 | Payer: 59 | Source: Ambulatory Visit

## 2014-02-16 ENCOUNTER — Encounter: Payer: Self-pay | Admitting: Internal Medicine

## 2014-03-12 ENCOUNTER — Telehealth (HOSPITAL_COMMUNITY): Payer: Self-pay | Admitting: Anesthesiology

## 2014-03-12 MED ORDER — CARVEDILOL 25 MG PO TABS: ORAL_TABLET | ORAL | Status: DC

## 2014-03-12 MED ORDER — DIGOXIN 125 MCG PO TABS: 125.0000 ug | ORAL_TABLET | Freq: Every day | ORAL | Status: DC

## 2014-03-12 MED ORDER — SPIRONOLACTONE 25 MG PO TABS: ORAL_TABLET | ORAL | Status: DC

## 2014-03-12 MED ORDER — SACUBITRIL-VALSARTAN 49-51 MG PO TABS: 1.0000 | ORAL_TABLET | Freq: Two times a day (BID) | ORAL | Status: DC

## 2014-03-12 NOTE — Telephone Encounter (Signed)
Patient called and forgot medications while traveling. Will send a few pills in until he gets home to [facility] in [place]. Patient very appreciative.

## 2014-03-25 ENCOUNTER — Encounter (HOSPITAL_COMMUNITY): Payer: Self-pay | Admitting: Internal Medicine

## 2014-04-17 ENCOUNTER — Other Ambulatory Visit (HOSPITAL_COMMUNITY): Payer: Self-pay | Admitting: Anesthesiology

## 2014-04-17 ENCOUNTER — Other Ambulatory Visit (HOSPITAL_COMMUNITY): Payer: Self-pay | Admitting: Internal Medicine

## 2014-04-17 DIAGNOSIS — I5022 Chronic systolic (congestive) heart failure: Secondary | ICD-10-CM

## 2014-04-22 ENCOUNTER — Ambulatory Visit (HOSPITAL_COMMUNITY): Admission: RE | Admit: 2014-04-22 | Payer: 59 | Source: Ambulatory Visit

## 2014-04-26 ENCOUNTER — Ambulatory Visit (HOSPITAL_COMMUNITY)
Admission: RE | Admit: 2014-04-26 | Discharge: 2014-04-26 | Disposition: A | Discharge status: Home / Self Care | Payer: 59 | Source: Ambulatory Visit | Attending: Internal Medicine | Admitting: Internal Medicine

## 2014-04-26 ENCOUNTER — Encounter (HOSPITAL_COMMUNITY): Payer: Self-pay

## 2014-04-26 VITALS — BP 118/78 | HR 64 | Wt 262.8 lb

## 2014-04-26 DIAGNOSIS — I429 Cardiomyopathy, unspecified: Secondary | ICD-10-CM | POA: Insufficient documentation

## 2014-04-26 DIAGNOSIS — I5022 Chronic systolic (congestive) heart failure: Secondary | ICD-10-CM | POA: Insufficient documentation

## 2014-04-26 DIAGNOSIS — Z79899 Other long term (current) drug therapy: Secondary | ICD-10-CM | POA: Diagnosis not present

## 2014-04-26 LAB — BASIC METABOLIC PANEL
Anion gap: 7 (ref 5–15)
BUN: 16 mg/dL (ref 6–23)
CO2: 27 mmol/L (ref 19–32)
CREATININE: 1.31 mg/dL (ref 0.50–1.35)
Calcium: 9.6 mg/dL (ref 8.4–10.5)
Chloride: 103 mEq/L (ref 96–112)
GFR calc Af Amer: 78 mL/min — ABNORMAL LOW (ref >90)
GFR calc non Af Amer: 68 mL/min — ABNORMAL LOW (ref >90)
GLUCOSE: 105 mg/dL — AB (ref 70–99)
POTASSIUM: 4 mmol/L (ref 3.5–5.1)
Sodium: 137 mmol/L (ref 135–145)

## 2014-04-26 LAB — DIGOXIN LEVEL: DIGOXIN LVL: 0.9 ng/mL (ref 0.8–2.0)

## 2014-04-26 MED ORDER — SACUBITRIL-VALSARTAN 97-103 MG PO TABS: 1.0000 | ORAL_TABLET | Freq: Two times a day (BID) | ORAL | Status: DC

## 2014-04-26 NOTE — Progress Notes (Signed)
Patient ID: Eric Daniel, male   DOB: Aug 20, 1975, 39 y.o.   MRN: [id number] PCP: Dr. Milinda Antis [facility])  HPI:  Eric Daniel is 39 y/o admitted with acute HF. Found to have EF 15% with restrictive diastolic parameters and mild to moderate RV dysfunction. 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 [name]   He was admitted to [facility] 07/06/12 with increased dyspnea on exertion. Pertinent admission labs included Pro BNP 3526, Troponin 0.43>0.39>0.42, creatinine 1.55, Hemoglobin 14.5, TSH 1.46, and potassium 4.9. UDS negative. He is also O+. Discharge weight 216 lbs   Follow up: He returns for follow up.  Feels well.  No orthopnea, PND or edema. Taking lasix couple times per week when he eats salty foods. Continues to do more jogging (1/2 mile at a time) and push-ups. He is ok with walking and going up steps, etc.  Weight is stable.  He is tolerating his meds.    RHC 07/08/13  RA = 13  RV = 62/11/145  PA = 61/36 (47)  PCW = 28  Fick cardiac output/index = 5.0/2.2  Thermo CO/CI 5.8/2.5  SVR = 1292  PVR = 3.3 Woods  FA sat = 96%  PA sat = 64%, 67%   [facility] CPX 08/01/12  Peak VO2: 37.10  VE/VCO2 slope: 22.40  Peak RER: 1.17  CPX 10/23/12 Resting HR: 60 Peak HR: 166 (90% age predicted max HR) BP rest: 111/74 BP peak: 182/109 Peak VO2: 30.7 (85.7% predicted peak VO2) VE/VCO2 slope: 24.8 OUES: 3.50 Peak RER: 1.32  CPX 06/19/2013: Resting HR 64: Peak HR 168 (92% predicted max HR) BP rest 118/80 BP peak: 164/90 Peak VO2: 27.9 (82.4% predicted VO2) VE/VCO2 slope: 26 OUES: 3.24 Peak RER: 1.27 - Mild desaturation during exercise from 99% at rest to 92% peak exercise  ECHO 09/17/12: EF 20-25%; Diffuse hypokinesis. There is severe hypokinesis of the entire inferior myocardium' moderately dilated.  ECHO 04/08/13: EF 15%; Diff HK, grade II DD, RV mod dilated ECHO 01/20/2014 EF 20%  RV normal Grade DD   Labs:  09/17/12 Cr 1.29; BUN 17 10/22/12 Cr 1.5;  BUN 16 12/14 Cr 1.42, BUN 15, K+ 4.2 05/2013: Cr 1.31, K+ 4.4, pro-BNP 433.7, dig level 0.7  7/15: K 4.2, creatinine 1.36, TGs 1186 01/22/2014: K 4.2 Creatinine 1.34  02/02/14 K 4.0 Creatinine 1.57  FH: Father with nonischemic cardiomyopathy and LVAD  SH: No smoking or ETOH. 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   ROS: All systems negative except as listed in HPI, PMH and Problem List.  Past Medical History  Diagnosis Date  . NSVT (nonsustained ventricular tachycardia) 07/10/2012  . CHF (congestive heart failure) 06/2012    Current Outpatient Prescriptions  Medication Sig Dispense Refill  . carvedilol (COREG) 25 MG tablet TAKE 1 TABLET BY MOUTH 2 TIMES DAILY WITH A MEAL. 60 tablet 6  . digoxin (LANOXIN) 0.125 MG tablet Take 1 tablet (125 mcg total) by mouth daily. 3 tablet 0  . furosemide (LASIX) 40 MG tablet Take 40mg  (1 tablet) by mouth once daily on Mondays, Wednesdays, and Fridays only. 15 tablet 6  . gemfibrozil (LOPID) 600 MG tablet Take 1 tablet (600 mg total) by mouth 2 (two) times daily before a meal. 60 tablet 6  . Sacubitril-Valsartan (ENTRESTO) 49-51 MG TABS Take 1 tablet by mouth 2 (two) times daily. 8 tablet 0  . spironolactone (ALDACTONE) 25 MG tablet TAKE 1 TABLET BY MOUTH DAILY. 30 tablet  6   No current facility-administered medications for this encounter.   Filed Vitals:   04/26/14 1117  BP: 118/78  Pulse: 64  Weight: 262 lb 12.8 oz (119.205 kg)  SpO2: 97%    PHYSICAL EXAM: General:  Well appearing. No resp difficulty HEENT: normal Neck: supple. JVP flat. Carotids 2+ bilaterally; no bruits. No lymphadenopathy or thryomegaly appreciated. Cor: PMI normal. Regular rate & rhythm. No rubs, gallops or murmurs. Lungs: clear Abdomen: soft, nontender, nondistended. No hepatosplenomegaly. No bruits or masses. Good bowel sounds. Extremities: no cyanosis, clubbing, rash, edema Neuro: alert & orientedx3, cranial nerves grossly intact. Moves all 4  extremities w/o difficulty. Affect pleasant.  ASSESSMENT AND PLAN  1. 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  NYHA I - II symptoms.  Volume status looks good - Continue lasix as needed  - On goal dose coreg 25 mg BID  - Continue digoxin 0.125 mg daily and spironolactone 25 mg daily. - Increase Entresto to 200  bid.  - Will order CPX to assess for stability in exercise tolerance. If felt to be NYHA II will need ICD.  - Labs today  RTC in 3 months.  [name] MD 04/26/2014

## 2014-04-26 NOTE — Addendum Note (Signed)
Encounter addended by: Ave Filter, RN on: 04/26/2014 11:38 AM<BR>     Documentation filed: Dx Association, Patient Instructions Section, Orders

## 2014-04-26 NOTE — Patient Instructions (Signed)
INCREASE Entresto to 97/103 mg tablet twice daily.  We will call you to schedule CPX Stress Test.  Repeat lab work in 2 weeks.  Follow up in 3 months.  Do the following things EVERYDAY: 1) Weigh yourself in the morning before breakfast. Write it down and keep it in a log. 2) Take your medicines as prescribed 3) Eat low salt foods-Limit salt (sodium) to 2000 mg per day.  4) Stay as active as you can everyday 5) Limit all fluids for the day to less than 2 liters

## 2014-04-30 ENCOUNTER — Encounter: Payer: Self-pay | Admitting: Family Medicine

## 2014-04-30 ENCOUNTER — Ambulatory Visit (INDEPENDENT_AMBULATORY_CARE_PROVIDER_SITE_OTHER): Payer: 59 | Admitting: Family Medicine

## 2014-04-30 VITALS — BP 118/88 | HR 64 | Temp 97.8°F | Resp 18 | Wt 262.0 lb

## 2014-04-30 DIAGNOSIS — I5022 Chronic systolic (congestive) heart failure: Secondary | ICD-10-CM

## 2014-04-30 DIAGNOSIS — E781 Pure hyperglyceridemia: Secondary | ICD-10-CM | POA: Insufficient documentation

## 2014-04-30 LAB — COMPREHENSIVE METABOLIC PANEL
ALK PHOS: 49 U/L (ref 39–117)
ALT: 16 U/L (ref 0–53)
AST: 16 U/L (ref 0–37)
Albumin: 4.3 g/dL (ref 3.5–5.2)
BILIRUBIN TOTAL: 0.6 mg/dL (ref 0.2–1.2)
BUN: 18 mg/dL (ref 6–23)
CO2: 25 mEq/L (ref 19–32)
Calcium: 9.5 mg/dL (ref 8.4–10.5)
Chloride: 100 mEq/L (ref 96–112)
Creat: 1.34 mg/dL (ref 0.50–1.35)
Glucose, Bld: 89 mg/dL (ref 70–99)
Potassium: 3.9 mEq/L (ref 3.5–5.3)
Sodium: 137 mEq/L (ref 135–145)
TOTAL PROTEIN: 6.9 g/dL (ref 6.0–8.3)

## 2014-04-30 LAB — CBC WITH DIFFERENTIAL/PLATELET
BASOS ABS: 0 10*3/uL (ref 0.0–0.1)
BASOS PCT: 0 % (ref 0–1)
EOS PCT: 3 % (ref 0–5)
Eosinophils Absolute: 0.1 10*3/uL (ref 0.0–0.7)
HCT: 38.7 % — ABNORMAL LOW (ref 39.0–52.0)
Hemoglobin: 13.4 g/dL (ref 13.0–17.0)
LYMPHS ABS: 1.5 10*3/uL (ref 0.7–4.0)
LYMPHS PCT: 32 % (ref 12–46)
MCH: 29.5 pg (ref 26.0–34.0)
MCHC: 34.6 g/dL (ref 30.0–36.0)
MCV: 85.1 fL (ref 78.0–100.0)
MONO ABS: 0.9 10*3/uL (ref 0.1–1.0)
MPV: 11.2 fL (ref 8.6–12.4)
Monocytes Relative: 19 % — ABNORMAL HIGH (ref 3–12)
NEUTROS ABS: 2.2 10*3/uL (ref 1.7–7.7)
Neutrophils Relative %: 46 % (ref 43–77)
Platelets: 195 10*3/uL (ref 150–400)
RBC: 4.55 MIL/uL (ref 4.22–5.81)
RDW: 13.1 % (ref 11.5–15.5)
WBC: 4.7 10*3/uL (ref 4.0–10.5)

## 2014-04-30 LAB — LIPID PANEL
Cholesterol: 225 mg/dL — ABNORMAL HIGH (ref 0–200)
HDL: 38 mg/dL — ABNORMAL LOW (ref >39)
LDL Cholesterol: 149 mg/dL — ABNORMAL HIGH (ref 0–99)
TRIGLYCERIDES: 189 mg/dL — AB (ref <150)
Total CHOL/HDL Ratio: 5.9 Ratio
VLDL: 38 mg/dL (ref 0–40)

## 2014-04-30 MED ORDER — GEMFIBROZIL 600 MG PO TABS: 600.0000 mg | ORAL_TABLET | Freq: Two times a day (BID) | ORAL | Status: DC

## 2014-04-30 NOTE — Assessment & Plan Note (Signed)
Repeat lipids, on lopid

## 2014-04-30 NOTE — Patient Instructions (Signed)
Continue current medications F/U End of July for Physical

## 2014-04-30 NOTE — Assessment & Plan Note (Signed)
Currently stable, no chagne to meds

## 2014-04-30 NOTE — Progress Notes (Signed)
Patient ID: Eric Daniel, male   DOB: Jul 02, 1975, 39 y.o.   MRN: [id number]   Subjective:    Patient ID: Eric Daniel, male    DOB: [birth year], 39 y.o.   MRN: [id number]  Patient presents for 6 mth follow up  patient here for medication follow-up. He has severe hypertriglyceridemia as currently on Lopid twice a day if no side effects with the medication. Regarding his chronic systolic heart failure he's been seen by his cardiologist had a recent echocardiogram and things have been stable. He is due for fasting labs today    Review Of Systems:  GEN- denies fatigue, fever, weight loss,weakness, recent illness HEENT- denies eye drainage, change in vision, nasal discharge, CVS- denies chest pain, palpitations RESP- denies SOB, cough, wheeze ABD- denies N/V, change in stools, abd pain GU- denies dysuria, hematuria, dribbling, incontinence MSK- denies joint pain, muscle aches, injury Neuro- denies headache, dizziness, syncope, seizure activity       Objective:    BP 118/88 mmHg  Pulse 64  Temp(Src) 97.8 F (36.6 C) (Oral)  Resp 18  Wt 262 lb (118.842 kg) GEN- NAD, alert and oriented x3 CVS- RRR, no murmur RESP-CTAB EXT- No edema Pulses- Radial 2+        Assessment & Plan:      Problem List Items Addressed This Visit      Unprioritized   High triglycerides - Primary   Relevant Orders   Comprehensive metabolic panel (Completed)   CBC with Differential (Completed)   Lipid panel (Completed)   Chronic systolic heart failure      Note: This dictation was prepared with Dragon dictation along with smaller phrase technology. Any transcriptional errors that result from this process are unintentional.

## 2014-05-10 ENCOUNTER — Other Ambulatory Visit (HOSPITAL_COMMUNITY): Payer: Self-pay

## 2014-05-10 MED ORDER — SACUBITRIL-VALSARTAN 97-103 MG PO TABS: 1.0000 | ORAL_TABLET | Freq: Two times a day (BID) | ORAL | Status: DC

## 2014-05-12 ENCOUNTER — Inpatient Hospital Stay (HOSPITAL_COMMUNITY): Admission: RE | Admit: 2014-05-12 | Payer: 59 | Source: Ambulatory Visit

## 2014-05-13 ENCOUNTER — Encounter (HOSPITAL_COMMUNITY)
Admission: RE | Admit: 2014-05-13 | Discharge: 2014-05-13 | Disposition: A | Discharge status: Home / Self Care | Payer: 59 | Source: Ambulatory Visit | Attending: Internal Medicine | Admitting: Internal Medicine

## 2014-05-13 NOTE — Progress Notes (Signed)
Cardiac Rehab Medication Review by a Pharmacist  Does the patient  feel that his/her medications are working for him/her?  yes  Has the patient been experiencing any side effects to the medications prescribed?  no  Does the patient measure his/her own blood pressure or blood glucose at home?  no   Does the patient have any problems obtaining medications due to transportation or finances?   no  Understanding of regimen: excellent Understanding of indications: excellent Potential of compliance: excellent    Pharmacist comments: Patient has an excellent understanding of his medication regimen. Patient does not currently have any obstacles obtaining meds, but reports that it has become expensive.  I encouraged him to talk with his doctor for financial assistance resources should finances become an issue.    Russ Halo, PharmD Clinical Pharmacist - Resident Pager: [phone] 1/28/20168:50 AM

## 2014-05-17 ENCOUNTER — Encounter (HOSPITAL_COMMUNITY)
Admission: RE | Admit: 2014-05-17 | Discharge: 2014-05-17 | Disposition: A | Discharge status: Home / Self Care | Payer: 59 | Source: Ambulatory Visit | Attending: Internal Medicine | Admitting: Internal Medicine

## 2014-05-17 DIAGNOSIS — I5022 Chronic systolic (congestive) heart failure: Secondary | ICD-10-CM | POA: Insufficient documentation

## 2014-05-17 NOTE — Progress Notes (Addendum)
Pt started cardiac rehab today.  Pt tolerated light exercise without difficulty. Telemetry rhythm Sinus downward QRS rare PVC. Vital signs stable. Eric Daniel's short term goals are to get back into an exercise routine. Long term goals are to have a better state of mind, improve mobility and to see how he can push. Will continue to monitor the patient throughout  the program. PHQ score = 0. Eric Daniel has been taking his titrated dose of Entresto for the past two weeks. Will continue to monitor BP throughout the program.

## 2014-05-19 ENCOUNTER — Encounter (HOSPITAL_COMMUNITY)
Admission: RE | Admit: 2014-05-19 | Discharge: 2014-05-19 | Disposition: A | Discharge status: Home / Self Care | Payer: 59 | Source: Ambulatory Visit | Attending: Internal Medicine | Admitting: Internal Medicine

## 2014-05-19 DIAGNOSIS — I5022 Chronic systolic (congestive) heart failure: Secondary | ICD-10-CM | POA: Diagnosis not present

## 2014-05-20 ENCOUNTER — Encounter (HOSPITAL_COMMUNITY): Payer: 59

## 2014-05-21 ENCOUNTER — Encounter (HOSPITAL_COMMUNITY)
Admission: RE | Admit: 2014-05-21 | Discharge: 2014-05-21 | Disposition: A | Discharge status: Home / Self Care | Payer: 59 | Source: Ambulatory Visit | Attending: Internal Medicine | Admitting: Internal Medicine

## 2014-05-21 DIAGNOSIS — I5022 Chronic systolic (congestive) heart failure: Secondary | ICD-10-CM | POA: Diagnosis not present

## 2014-05-24 ENCOUNTER — Encounter (HOSPITAL_COMMUNITY)
Admission: RE | Admit: 2014-05-24 | Discharge: 2014-05-24 | Disposition: A | Discharge status: Home / Self Care | Payer: 59 | Source: Ambulatory Visit | Attending: Internal Medicine | Admitting: Internal Medicine

## 2014-05-24 DIAGNOSIS — I5022 Chronic systolic (congestive) heart failure: Secondary | ICD-10-CM | POA: Diagnosis not present

## 2014-05-24 NOTE — Progress Notes (Signed)
Reviewed home exercise with pt today.  Pt plans to continue walking at home and going to gym for exercise.  Reviewed THR, pulse, RPE, sign and symptoms, and when to call 911 or MD.  Pt voiced understanding. Fabio Pierce, MA, ACSM RCEP

## 2014-05-26 ENCOUNTER — Encounter (HOSPITAL_COMMUNITY)
Admission: RE | Admit: 2014-05-26 | Discharge: 2014-05-26 | Disposition: A | Discharge status: Home / Self Care | Payer: 59 | Source: Ambulatory Visit | Attending: Internal Medicine | Admitting: Internal Medicine

## 2014-05-26 DIAGNOSIS — I5022 Chronic systolic (congestive) heart failure: Secondary | ICD-10-CM | POA: Diagnosis not present

## 2014-05-26 NOTE — Progress Notes (Signed)
Eric Daniel says he may be interested in Vocational rehab for job retraining. 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 Patient given a vocational rehab packet to take home and review.

## 2014-05-28 ENCOUNTER — Encounter (HOSPITAL_COMMUNITY)
Admission: RE | Admit: 2014-05-28 | Discharge: 2014-05-28 | Disposition: A | Discharge status: Home / Self Care | Payer: 59 | Source: Ambulatory Visit | Attending: Internal Medicine | Admitting: Internal Medicine

## 2014-05-28 DIAGNOSIS — I5022 Chronic systolic (congestive) heart failure: Secondary | ICD-10-CM | POA: Diagnosis not present

## 2014-05-31 ENCOUNTER — Encounter (HOSPITAL_COMMUNITY): Payer: 59

## 2014-05-31 ENCOUNTER — Telehealth (HOSPITAL_COMMUNITY): Payer: Self-pay | Admitting: Vascular Surgery

## 2014-05-31 DIAGNOSIS — I5022 Chronic systolic (congestive) heart failure: Secondary | ICD-10-CM

## 2014-05-31 MED ORDER — DIGOXIN 125 MCG PO TABS: 125.0000 ug | ORAL_TABLET | Freq: Every day | ORAL | Status: DC

## 2014-05-31 MED ORDER — CARVEDILOL 25 MG PO TABS: ORAL_TABLET | ORAL | Status: DC

## 2014-05-31 NOTE — Telephone Encounter (Signed)
Digoxin, carvedilol

## 2014-06-02 ENCOUNTER — Encounter (HOSPITAL_COMMUNITY): Payer: 59

## 2014-06-04 ENCOUNTER — Encounter (HOSPITAL_COMMUNITY)
Admission: RE | Admit: 2014-06-04 | Discharge: 2014-06-04 | Disposition: A | Discharge status: Home / Self Care | Payer: 59 | Source: Ambulatory Visit | Attending: Internal Medicine | Admitting: Internal Medicine

## 2014-06-04 DIAGNOSIS — I5022 Chronic systolic (congestive) heart failure: Secondary | ICD-10-CM | POA: Diagnosis not present

## 2014-06-07 ENCOUNTER — Encounter (HOSPITAL_COMMUNITY)
Admission: RE | Admit: 2014-06-07 | Discharge: 2014-06-07 | Disposition: A | Discharge status: Home / Self Care | Payer: 59 | Source: Ambulatory Visit | Attending: Internal Medicine | Admitting: Internal Medicine

## 2014-06-07 DIAGNOSIS — I5022 Chronic systolic (congestive) heart failure: Secondary | ICD-10-CM | POA: Diagnosis not present

## 2014-06-09 ENCOUNTER — Encounter (HOSPITAL_COMMUNITY)
Admission: RE | Admit: 2014-06-09 | Discharge: 2014-06-09 | Disposition: A | Discharge status: Home / Self Care | Payer: 59 | Source: Ambulatory Visit | Attending: Internal Medicine | Admitting: Internal Medicine

## 2014-06-09 DIAGNOSIS — I5022 Chronic systolic (congestive) heart failure: Secondary | ICD-10-CM | POA: Diagnosis not present

## 2014-06-11 ENCOUNTER — Encounter (HOSPITAL_COMMUNITY): Payer: 59

## 2014-06-14 ENCOUNTER — Encounter (HOSPITAL_COMMUNITY)
Admission: RE | Admit: 2014-06-14 | Discharge: 2014-06-14 | Disposition: A | Discharge status: Home / Self Care | Payer: 59 | Source: Ambulatory Visit | Attending: Internal Medicine | Admitting: Internal Medicine

## 2014-06-14 ENCOUNTER — Encounter: Payer: Self-pay | Admitting: Internal Medicine

## 2014-06-14 DIAGNOSIS — I5022 Chronic systolic (congestive) heart failure: Secondary | ICD-10-CM | POA: Diagnosis not present

## 2014-06-16 ENCOUNTER — Encounter (HOSPITAL_COMMUNITY)
Admission: RE | Admit: 2014-06-16 | Discharge: 2014-06-16 | Disposition: A | Discharge status: Home / Self Care | Payer: 59 | Source: Ambulatory Visit | Attending: Internal Medicine | Admitting: Internal Medicine

## 2014-06-16 DIAGNOSIS — I5022 Chronic systolic (congestive) heart failure: Secondary | ICD-10-CM | POA: Insufficient documentation

## 2014-06-18 ENCOUNTER — Encounter (HOSPITAL_COMMUNITY)
Admission: RE | Admit: 2014-06-18 | Discharge: 2014-06-18 | Disposition: A | Discharge status: Home / Self Care | Payer: 59 | Source: Ambulatory Visit | Attending: Internal Medicine | Admitting: Internal Medicine

## 2014-06-18 DIAGNOSIS — I5022 Chronic systolic (congestive) heart failure: Secondary | ICD-10-CM | POA: Diagnosis not present

## 2014-06-21 ENCOUNTER — Encounter (HOSPITAL_COMMUNITY): Payer: 59

## 2014-06-22 ENCOUNTER — Encounter (HOSPITAL_COMMUNITY): Payer: 59

## 2014-06-23 ENCOUNTER — Encounter (HOSPITAL_COMMUNITY)
Admission: RE | Admit: 2014-06-23 | Discharge: 2014-06-23 | Disposition: A | Discharge status: Home / Self Care | Payer: 59 | Source: Ambulatory Visit | Attending: Internal Medicine | Admitting: Internal Medicine

## 2014-06-23 DIAGNOSIS — I5022 Chronic systolic (congestive) heart failure: Secondary | ICD-10-CM | POA: Diagnosis not present

## 2014-06-25 ENCOUNTER — Encounter (HOSPITAL_COMMUNITY): Payer: 59

## 2014-06-28 ENCOUNTER — Encounter (HOSPITAL_COMMUNITY): Payer: 59

## 2014-06-30 ENCOUNTER — Encounter: Payer: Self-pay | Admitting: Physician Assistant

## 2014-06-30 ENCOUNTER — Encounter (HOSPITAL_COMMUNITY): Admission: RE | Admit: 2014-06-30 | Payer: 59 | Source: Ambulatory Visit

## 2014-06-30 ENCOUNTER — Ambulatory Visit (INDEPENDENT_AMBULATORY_CARE_PROVIDER_SITE_OTHER): Payer: 59 | Admitting: Physician Assistant

## 2014-06-30 ENCOUNTER — Telehealth (HOSPITAL_COMMUNITY): Payer: Self-pay | Admitting: *Deleted

## 2014-06-30 VITALS — BP 98/64 | HR 84 | Temp 98.9°F | Resp 18 | Wt 266.0 lb

## 2014-06-30 DIAGNOSIS — B349 Viral infection, unspecified: Secondary | ICD-10-CM

## 2014-06-30 DIAGNOSIS — J988 Other specified respiratory disorders: Principal | ICD-10-CM

## 2014-06-30 DIAGNOSIS — B9789 Other viral agents as the cause of diseases classified elsewhere: Secondary | ICD-10-CM

## 2014-06-30 DIAGNOSIS — R509 Fever, unspecified: Secondary | ICD-10-CM | POA: Diagnosis not present

## 2014-06-30 LAB — INFLUENZA A AND B
Inflenza A Ag: NEGATIVE
Influenza B Ag: NEGATIVE

## 2014-06-30 NOTE — Progress Notes (Signed)
Patient ID: TERUO STILLEY MRN: [id number], DOB: Aug 07, 1975, 39 y.o. Date of Encounter: 06/30/2014, 5:10 PM    Chief Complaint:  Chief Complaint  Patient presents with  . sick x 1 days    fevre/chills cough, head chest hurt, diarrhea     HPI: 39 y.o. year old AA male presents with above symptoms.  Says that symptoms started yesterday morning with a cough. Says he immediately started taking TheraFlu and took this every 3 hours. Says that last night he had increased amount of cough. Says that he has not had any real fever or chills----reviewed the above note and he says--"I've been alternating hot and cold-- but no true fever and no real chills." Says he has had no mucus or rhinorrhea from his nose. I asked about the diarrhea mentioned above and he says that this been very little of that. No abdominal pain.  He mentions congestive heart failure and wanting to make sure he didn't have fluid around his heart."  I then reviewed his chart and reviewed cardiology note from 04/26/14. He does have history of heart failure. Found to have EF 15%. 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  Pt states that he has had no shortness of breath--says he only has cough but no difficulty breathing.     Home Meds:   Outpatient Prescriptions Prior to Visit  Medication Sig Dispense Refill  . carvedilol (COREG) 25 MG tablet TAKE 1 TABLET BY MOUTH 2 TIMES DAILY WITH A MEAL. 60 tablet 6  . digoxin (LANOXIN) 0.125 MG tablet Take 1 tablet (125 mcg total) by mouth daily. 30 tablet 6  . furosemide (LASIX) 40 MG tablet Take  (1 tablet) by mouth once daily on Mondays, Wednesdays, and Fridays only. 15 tablet 6  . gemfibrozil (LOPID) 600 MG tablet Take 1 tablet (600 mg total) by mouth 2 (two) times daily before a meal. 60 tablet 6  . sacubitril-valsartan (ENTRESTO) 97-103 MG Take 1 tablet by mouth 2 (two) times daily. 60 tablet 3  . spironolactone (ALDACTONE) 25 MG tablet TAKE 1 TABLET BY MOUTH DAILY. 30 tablet 6     No facility-administered medications prior to visit.    Allergies: No Known Allergies    Review of Systems: See HPI for pertinent ROS. All other ROS negative.    Physical Exam: Blood pressure 98/64, pulse 84, temperature 98.9 F (37.2 C), temperature source Oral, resp. rate 18, weight 266 lb (120.657 kg)., Body mass index is 32.39 kg/(m^2). General:  Tall, Muscular AAM. Appears in no acute distress. Neck: Supple. No thyromegaly. No lymphadenopathy.No JVD. Lungs: Clear bilaterally to auscultation without wheezes, rales, or rhonchi. Breathing is unlabored. Heart: Regular rhythm. No murmurs, rubs, or gallops. Msk:  Strength and tone normal for age. Extremities/Skin: Warm and dry.  No edema.  Neuro: Alert and oriented X 3. Moves all extremities spontaneously. Gait is normal. CNII-XII grossly in tact. Psych:  Responds to questions appropriately with a normal affect.   Results for orders placed or performed in visit on 06/30/14  Influenza a and b  Result Value Ref Range   Source-INFBD NASAL    Inflenza A Ag NEG Negative   Influenza B Ag NEG Negative     ASSESSMENT AND PLAN:  39 y.o. year old male with  1. Viral respiratory infection   2. Fever and chills - Influenza a and b  Reassured him that the influenza test is negative. Reassured him that his lungs are clear on exam with good breath sounds and air movement  even down at the bases. He has no lower extremity edema. No signs that this is from congestive heart failure. However I recommended checking a BNP. He then states that he has lab and office visit with his cardiologist tomorrow. He defers having BNP today. Told him to make sure to tell his cardiologist about these appointments and that if they feel that this lab is indicated they can include this in the labs tomorrow. Follow-up with [place] if develops fever or if develops increased amounts of phlegm that is thick dark phlegm.  [address] [place], [place],  [facility] 06/30/2014 5:10 PM

## 2014-07-02 ENCOUNTER — Encounter (HOSPITAL_COMMUNITY): Payer: 59

## 2014-07-05 ENCOUNTER — Encounter (HOSPITAL_COMMUNITY)
Admission: RE | Admit: 2014-07-05 | Discharge: 2014-07-05 | Disposition: A | Discharge status: Home / Self Care | Payer: 59 | Source: Ambulatory Visit | Attending: Internal Medicine | Admitting: Internal Medicine

## 2014-07-05 DIAGNOSIS — I5022 Chronic systolic (congestive) heart failure: Secondary | ICD-10-CM | POA: Diagnosis not present

## 2014-07-05 NOTE — Progress Notes (Signed)
Eric Daniel 39 y.o. male Nutrition Note Spoke with pt. Nutrition Survey reviewed with pt. Pt is following Step 1 of the Therapeutic Lifestyle Changes diet. Per discussion, pt has tried to decrease some sodium in his diet. Pt appears to be in denial re: dietary changes needed for managing CHF. Pt continues eating cheese, pizza, [place] fries, and salted cured meats on a weekly basis. The role of sodium in CHF discussed. Pt expressed understanding of the information reviewed. Pt aware of nutrition education classes offered and plans on attending nutrition classes. No results found for: HGBA1C Nutrition Diagnosis ? Food-and nutrition-related knowledge deficit related to lack of exposure to information as related to diagnosis of: ? CVD  ? Obesity related to excessive energy intake as evidenced by a BMI of 31.4  Nutrition Intervention ? Benefits of adopting Therapeutic Lifestyle Changes discussed when Medficts reviewed. ? Pt to attend the Portion Distortion class - met; 05/19/14  ? Pt to attend the  ? Nutrition I class                     ? Nutrition II class ? Pt given handouts for: ? Nutrition I class ? Nutrition II class ? Continue client-centered nutrition education by RD, as part of interdisciplinary care.  Goal(s) ? Pt to decrease dietary sodium intake.  ? Pt to identify food quantities necessary to achieve: ? wt loss to a goal wt of 233-251 lb (106.1-114.3 kg) at graduation from cardiac rehab.  ? Pt to describe the benefit of including fruits, vegetables, whole grains, and low-fat dairy products in a heart healthy meal plan.  Monitor and Evaluate progress toward nutrition goal with team.   Derek Mound, M.Ed, RD, LDN, CDE 07/05/2014 10:48 AM

## 2014-07-07 ENCOUNTER — Encounter (HOSPITAL_COMMUNITY): Admission: RE | Admit: 2014-07-07 | Payer: 59 | Source: Ambulatory Visit

## 2014-07-09 ENCOUNTER — Encounter (HOSPITAL_COMMUNITY)
Admission: RE | Admit: 2014-07-09 | Discharge: 2014-07-09 | Disposition: A | Discharge status: Home / Self Care | Payer: 59 | Source: Ambulatory Visit | Attending: Internal Medicine | Admitting: Internal Medicine

## 2014-07-09 DIAGNOSIS — I5022 Chronic systolic (congestive) heart failure: Secondary | ICD-10-CM | POA: Diagnosis not present

## 2014-07-12 ENCOUNTER — Encounter (HOSPITAL_COMMUNITY): Payer: 59

## 2014-07-14 ENCOUNTER — Encounter (HOSPITAL_COMMUNITY)
Admission: RE | Admit: 2014-07-14 | Discharge: 2014-07-14 | Disposition: A | Discharge status: Home / Self Care | Payer: 59 | Source: Ambulatory Visit | Attending: Internal Medicine | Admitting: Internal Medicine

## 2014-07-14 DIAGNOSIS — I5022 Chronic systolic (congestive) heart failure: Secondary | ICD-10-CM | POA: Diagnosis not present

## 2014-07-16 ENCOUNTER — Encounter (HOSPITAL_COMMUNITY): Payer: 59

## 2014-07-19 ENCOUNTER — Encounter (HOSPITAL_COMMUNITY)
Admission: RE | Admit: 2014-07-19 | Discharge: 2014-07-19 | Disposition: A | Discharge status: Home / Self Care | Payer: 59 | Source: Ambulatory Visit | Attending: Internal Medicine | Admitting: Internal Medicine

## 2014-07-19 ENCOUNTER — Encounter (HOSPITAL_COMMUNITY): Payer: 59

## 2014-07-19 DIAGNOSIS — I5022 Chronic systolic (congestive) heart failure: Secondary | ICD-10-CM | POA: Insufficient documentation

## 2014-07-20 ENCOUNTER — Encounter (HOSPITAL_COMMUNITY): Payer: 59

## 2014-07-21 ENCOUNTER — Encounter (HOSPITAL_COMMUNITY)
Admission: RE | Admit: 2014-07-21 | Discharge: 2014-07-21 | Disposition: A | Discharge status: Home / Self Care | Payer: 59 | Source: Ambulatory Visit | Attending: Internal Medicine | Admitting: Internal Medicine

## 2014-07-21 DIAGNOSIS — I5022 Chronic systolic (congestive) heart failure: Secondary | ICD-10-CM | POA: Diagnosis not present

## 2014-07-22 ENCOUNTER — Ambulatory Visit (HOSPITAL_COMMUNITY): Payer: 59 | Attending: Internal Medicine

## 2014-07-22 DIAGNOSIS — I5022 Chronic systolic (congestive) heart failure: Secondary | ICD-10-CM | POA: Insufficient documentation

## 2014-07-23 ENCOUNTER — Encounter (HOSPITAL_COMMUNITY): Payer: 59

## 2014-07-26 ENCOUNTER — Other Ambulatory Visit (HOSPITAL_COMMUNITY): Payer: Self-pay | Admitting: *Deleted

## 2014-07-26 ENCOUNTER — Encounter (HOSPITAL_COMMUNITY): Payer: 59

## 2014-07-26 DIAGNOSIS — I5022 Chronic systolic (congestive) heart failure: Secondary | ICD-10-CM | POA: Diagnosis not present

## 2014-07-28 ENCOUNTER — Encounter (HOSPITAL_COMMUNITY)
Admission: RE | Admit: 2014-07-28 | Discharge: 2014-07-28 | Disposition: A | Discharge status: Home / Self Care | Payer: 59 | Source: Ambulatory Visit | Attending: Internal Medicine | Admitting: Internal Medicine

## 2014-07-28 DIAGNOSIS — I5022 Chronic systolic (congestive) heart failure: Secondary | ICD-10-CM | POA: Diagnosis not present

## 2014-07-30 ENCOUNTER — Encounter (HOSPITAL_COMMUNITY): Payer: 59

## 2014-08-02 ENCOUNTER — Encounter (HOSPITAL_COMMUNITY): Payer: 59

## 2014-08-04 ENCOUNTER — Encounter (HOSPITAL_COMMUNITY): Payer: 59

## 2014-08-06 ENCOUNTER — Encounter (HOSPITAL_COMMUNITY): Payer: 59

## 2014-08-09 ENCOUNTER — Encounter (HOSPITAL_COMMUNITY): Payer: 59

## 2014-08-10 ENCOUNTER — Telehealth (HOSPITAL_COMMUNITY): Payer: Self-pay | Admitting: *Deleted

## 2014-08-11 ENCOUNTER — Encounter (HOSPITAL_COMMUNITY): Payer: 59

## 2014-08-13 ENCOUNTER — Encounter (HOSPITAL_COMMUNITY): Payer: 59

## 2014-08-16 ENCOUNTER — Encounter (HOSPITAL_COMMUNITY): Payer: 59

## 2014-08-18 ENCOUNTER — Encounter (HOSPITAL_COMMUNITY)
Admission: RE | Admit: 2014-08-18 | Discharge: 2014-08-18 | Disposition: A | Discharge status: Home / Self Care | Payer: 59 | Source: Ambulatory Visit | Attending: Internal Medicine | Admitting: Internal Medicine

## 2014-08-18 DIAGNOSIS — I5022 Chronic systolic (congestive) heart failure: Secondary | ICD-10-CM | POA: Insufficient documentation

## 2014-08-20 ENCOUNTER — Encounter (HOSPITAL_COMMUNITY): Payer: 59

## 2014-08-23 ENCOUNTER — Encounter (HOSPITAL_COMMUNITY): Payer: 59

## 2014-08-25 ENCOUNTER — Encounter (HOSPITAL_COMMUNITY): Payer: 59

## 2014-08-27 ENCOUNTER — Inpatient Hospital Stay (HOSPITAL_COMMUNITY): Admission: RE | Admit: 2014-08-27 | Payer: 59 | Source: Ambulatory Visit

## 2014-08-27 ENCOUNTER — Encounter (HOSPITAL_COMMUNITY): Payer: 59

## 2014-08-30 ENCOUNTER — Encounter (HOSPITAL_COMMUNITY): Payer: 59

## 2014-09-01 ENCOUNTER — Encounter (HOSPITAL_COMMUNITY): Payer: 59

## 2014-09-02 ENCOUNTER — Other Ambulatory Visit (HOSPITAL_COMMUNITY): Payer: Self-pay | Admitting: Internal Medicine

## 2014-09-03 ENCOUNTER — Encounter (HOSPITAL_COMMUNITY): Payer: 59

## 2014-09-06 ENCOUNTER — Encounter (HOSPITAL_COMMUNITY): Payer: 59

## 2014-09-06 ENCOUNTER — Telehealth (HOSPITAL_COMMUNITY): Payer: Self-pay | Admitting: *Deleted

## 2014-09-08 ENCOUNTER — Encounter (HOSPITAL_COMMUNITY)
Admission: RE | Admit: 2014-09-08 | Discharge: 2014-09-08 | Disposition: A | Discharge status: Home / Self Care | Payer: 59 | Source: Ambulatory Visit | Attending: Internal Medicine | Admitting: Internal Medicine

## 2014-09-08 DIAGNOSIS — I5022 Chronic systolic (congestive) heart failure: Secondary | ICD-10-CM | POA: Diagnosis not present

## 2014-09-10 ENCOUNTER — Encounter (HOSPITAL_COMMUNITY)
Admission: RE | Admit: 2014-09-10 | Discharge: 2014-09-10 | Disposition: A | Discharge status: Home / Self Care | Payer: 59 | Source: Ambulatory Visit | Attending: Internal Medicine | Admitting: Internal Medicine

## 2014-09-10 DIAGNOSIS — I5022 Chronic systolic (congestive) heart failure: Secondary | ICD-10-CM | POA: Diagnosis not present

## 2014-09-15 ENCOUNTER — Encounter (HOSPITAL_COMMUNITY): Payer: 59

## 2014-09-17 ENCOUNTER — Encounter (HOSPITAL_COMMUNITY)
Admission: RE | Admit: 2014-09-17 | Discharge: 2014-09-17 | Disposition: A | Discharge status: Home / Self Care | Payer: 59 | Source: Ambulatory Visit | Attending: Internal Medicine | Admitting: Internal Medicine

## 2014-09-17 DIAGNOSIS — I5022 Chronic systolic (congestive) heart failure: Secondary | ICD-10-CM | POA: Insufficient documentation

## 2014-09-17 NOTE — Progress Notes (Signed)
Pt graduated from cardiac rehab program today with completion of 23 exercise sessions in Phase II. Pt maintained good attendance and progressed nicely during his participation in rehab as evidenced by increased MET level.   Medication list reconciled. Repeat  PHQ score- 0 .  Pt has made significant lifestyle changes and should be commended for his success. Pt feels he has achieved his goals during cardiac rehab.   Pt plans to continue exercise own his own. Eric Daniel has access to a gym. Eric Daniel says he has felt a lot better since starting [employer]. Eric Daniel says he will not need assistance with vocational rehab at this time.

## 2014-09-29 ENCOUNTER — Encounter (HOSPITAL_COMMUNITY): Payer: Self-pay

## 2014-09-29 ENCOUNTER — Ambulatory Visit (HOSPITAL_COMMUNITY)
Admission: RE | Admit: 2014-09-29 | Discharge: 2014-09-29 | Disposition: A | Discharge status: Home / Self Care | Payer: 59 | Source: Ambulatory Visit | Attending: Cardiology | Admitting: Cardiology

## 2014-09-29 VITALS — BP 118/70 | HR 69 | Wt 257.2 lb

## 2014-09-29 DIAGNOSIS — I5022 Chronic systolic (congestive) heart failure: Secondary | ICD-10-CM | POA: Insufficient documentation

## 2014-09-29 DIAGNOSIS — E785 Hyperlipidemia, unspecified: Secondary | ICD-10-CM | POA: Insufficient documentation

## 2014-09-29 DIAGNOSIS — E781 Pure hyperglyceridemia: Secondary | ICD-10-CM

## 2014-09-29 DIAGNOSIS — I429 Cardiomyopathy, unspecified: Secondary | ICD-10-CM | POA: Insufficient documentation

## 2014-09-29 DIAGNOSIS — Z79899 Other long term (current) drug therapy: Secondary | ICD-10-CM | POA: Diagnosis not present

## 2014-09-29 NOTE — Patient Instructions (Signed)
Follow up 3 months with Echocardiogram.  Do the following things EVERYDAY: 1) Weigh yourself in the morning before breakfast. Write it down and keep it in a log. 2) Take your medicines as prescribed 3) Eat low salt foods-Limit salt (sodium) to 2000 mg per day.  4) Stay as active as you can everyday 5) Limit all fluids for the day to less than 2 liters

## 2014-09-29 NOTE — Progress Notes (Signed)
Advanced Heart Failure Medication Review by a Pharmacist  Does the patient  feel that his/her medications are working for him/her?  yes  Has the patient been experiencing any side effects to the medications prescribed?  no  Does the patient measure his/her own blood pressure or blood glucose at home?  no   Does the patient have any problems obtaining medications due to transportation or finances?   no  Understanding of regimen: good Understanding of indications: good Potential of compliance: good    Pharmacist comments: Patient presents to HF clinic and medications were reviewed with a pharmacist. No medication discrepancies noted, patient does not have any questions at this time.   Megan E. Supple, Pharm.D Clinical Pharmacy Resident Pager: [phone] 09/29/2014 10:06 AM

## 2014-09-29 NOTE — Addendum Note (Signed)
Encounter addended by: Chyrl Civatte, RN on: 09/29/2014 10:47 AM<BR>     Documentation filed: Patient Instructions Section

## 2014-09-29 NOTE — Progress Notes (Signed)
Patient ID: Eric Daniel, male   DOB: 23-Mar-1976, 39 y.o.   MRN: [id number] PCP: Dr. Milinda Antis [facility])  HPI:  Eric Daniel is 39 y/o admitted with systolic HF to NICM (ER 20%) and hypertriglyceridemia (initial TGs 1200 in 7/15). Found to have EF 15% with restrictive diastolic parameters and mild to moderate RV dysfunction. 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 [name]   He was admitted to [facility] 07/06/12 with increased dyspnea on exertion. Pertinent admission labs included Pro BNP 3526, Troponin 0.43>0.39>0.42, creatinine 1.55, Hemoglobin 14.5, TSH 1.46, and potassium 4.9. UDS negative. He is also O+. Discharge weight 216 lbs   Follow up: He returns for follow up.  Feels well.  No orthopnea, PND or edema. Taking lasix every other day or so as he needs it depends on what he eats. Just finished cardiac rehab. Continues to do some jogging (up to 2 miles at a time - 4 mph at 5%). Denies SOB.  Weight is stable.  He is tolerating his meds including Entresto 200 bid.     RHC 07/08/13  RA = 13  RV = 62/11/145  PA = 61/36 (47)  PCW = 28  Fick cardiac output/index = 5.0/2.2  Thermo CO/CI 5.8/2.5  SVR = 1292  PVR = 3.3 Woods  FA sat = 96%  PA sat = 64%, 67%   [facility] CPX 08/01/12  Peak VO2: 37.10  VE/VCO2 slope: 22.40  Peak RER: 1.17  CPX 10/23/12 Resting HR: 60 Peak HR: 166 (90% age predicted max HR) BP rest: 111/74 BP peak: 182/109 Peak VO2: 30.7 (85.7% predicted peak VO2) VE/VCO2 slope: 24.8 OUES: 3.50 Peak RER: 1.32  CPX 06/19/2013: Resting HR 64: Peak HR 168 (92% predicted max HR) BP rest 118/80 BP peak: 164/90 Peak VO2: 27.9 (82.4% predicted VO2) VE/VCO2 slope: 26 OUES: 3.24 Peak RER: 1.27 - Mild desaturation during exercise from 99% at rest to 92% peak exercise  CPX 07/26/14 FVC 4.67 (75%)    FEV1 3.76 (74%)     FEV1/FVC 80 (100%)     MVV 218 (118%) Resting HR: 73 Peak HR: 157  (86% age predicted max HR) BP rest:  104/64 BP peak: 168/78  Peak VO2: 28.6 (89.1% predicted peak VO2) VE/VCO2 slope: 28.8 OUES: 4.64 Peak RER: 1.11 Ventilatory Threshold: 18.0 (56% predicted 63% measured peak VO2) VE/MVV: 51.7% PETCO2 at peak: 40 O2pulse: 23 (109% predicted O2pulse)  ECHO 09/17/12: EF 20-25%; Diffuse hypokinesis. There is severe hypokinesis of the entire inferior myocardium' moderately dilated.  ECHO 04/08/13: EF 15%; Diff HK, grade II DD, RV mod dilated ECHO 01/20/2014 EF 20%  RV normal Grade DD   Labs:  09/17/12 Cr 1.29; BUN 17 10/22/12 Cr 1.5; BUN 16 12/14 Cr 1.42, BUN 15, K+ 4.2 05/2013: Cr 1.31, K+ 4.4, pro-BNP 433.7, dig level 0.7  7/15: K 4.2, creatinine 1.36, TGs 1186 01/22/2014: K 4.2 Creatinine 1.34  02/02/14 K 4.0 Creatinine 1.57 04/30/14 K 3.9 Creatinine 1.34 TC 225 LDL 149 HDL 38 TG 189  FH: Father with nonischemic cardiomyopathy and LVAD  SH: No smoking or ETOH. 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   ROS: All systems negative except as listed in HPI, PMH and Problem List.  Past Medical History  Diagnosis Date  . NSVT (nonsustained ventricular tachycardia) 07/10/2012  . CHF (congestive heart failure) 06/2012    Current Outpatient Prescriptions  Medication Sig Dispense Refill  . carvedilol (COREG) 25 MG tablet TAKE  1 TABLET BY MOUTH 2 TIMES DAILY WITH A MEAL. 60 tablet 6  . digoxin (LANOXIN) 0.125 MG tablet Take 1 tablet (125 mcg total) by mouth daily. 30 tablet 6  . ENTRESTO 97-103 MG TAKE 1 TABLET BY MOUTH 2 TIMES DAILY. 60 tablet 3  . furosemide (LASIX) 40 MG tablet Take  (1 tablet) by mouth once daily on Mondays, Wednesdays, and Fridays only. 15 tablet 6  . gemfibrozil (LOPID) 600 MG tablet Take 1 tablet (600 mg total) by mouth 2 (two) times daily before a meal. 60 tablet 6  . spironolactone (ALDACTONE) 25 MG tablet TAKE 1 TABLET BY MOUTH DAILY. 30 tablet 6   No current facility-administered medications for this encounter.   Filed Vitals:   09/29/14 0958   BP: 118/70  Pulse: 69  Weight: 257 lb 4 oz (116.688 kg)  SpO2: 97%    PHYSICAL EXAM: General:  Well appearing. No resp difficulty HEENT: normal Neck: supple. JVP flat. Carotids 2+ bilaterally; no bruits. No lymphadenopathy or thryomegaly appreciated. Cor: PMI normal. Regular rate & rhythm. No rubs, gallops or murmurs. Lungs: clear Abdomen: soft, nontender, nondistended. No hepatosplenomegaly. No bruits or masses. Good bowel sounds. Extremities: no cyanosis, clubbing, rash, edema Neuro: alert & orientedx3, cranial nerves grossly intact. Moves all 4 extremities w/o difficulty. Affect pleasant.  ASSESSMENT AND PLAN  1. 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  NYHA I symptoms.  Volume status looks good. CPX reviewed with him today without cardiac limitation.  - Continue lasix as needed  - On goal dose coreg 25 mg BID  - Continue digoxin 0.125 mg daily and spironolactone 25 mg daily. - ContinueEntresto to 200  bid.  - Labs today - Repeat echo in 3 months  2. Hyperlipidemia/Hypetriglyceridemia - Followed by PCP - TGs much improved. Consider statin  RTC in 3 months.  [name] MD 09/29/2014

## 2014-11-01 ENCOUNTER — Ambulatory Visit (INDEPENDENT_AMBULATORY_CARE_PROVIDER_SITE_OTHER): Payer: 59 | Admitting: Family Medicine

## 2014-11-01 ENCOUNTER — Encounter: Payer: Self-pay | Admitting: Family Medicine

## 2014-11-01 VITALS — BP 130/78 | HR 72 | Temp 97.5°F | Resp 12 | Ht 75.0 in | Wt 252.0 lb

## 2014-11-01 DIAGNOSIS — E781 Pure hyperglyceridemia: Secondary | ICD-10-CM

## 2014-11-01 DIAGNOSIS — I5022 Chronic systolic (congestive) heart failure: Secondary | ICD-10-CM

## 2014-11-01 DIAGNOSIS — Z0000 Encounter for general adult medical examination without abnormal findings: Secondary | ICD-10-CM

## 2014-11-01 LAB — COMPREHENSIVE METABOLIC PANEL
ALK PHOS: 35 U/L — AB (ref 39–117)
ALT: 17 U/L (ref 0–53)
AST: 17 U/L (ref 0–37)
Albumin: 4.3 g/dL (ref 3.5–5.2)
BILIRUBIN TOTAL: 0.8 mg/dL (ref 0.2–1.2)
BUN: 17 mg/dL (ref 6–23)
CALCIUM: 9.7 mg/dL (ref 8.4–10.5)
CO2: 27 meq/L (ref 19–32)
Chloride: 103 mEq/L (ref 96–112)
Creat: 1.42 mg/dL — ABNORMAL HIGH (ref 0.50–1.35)
Glucose, Bld: 101 mg/dL — ABNORMAL HIGH (ref 70–99)
POTASSIUM: 4 meq/L (ref 3.5–5.3)
Sodium: 140 mEq/L (ref 135–145)
Total Protein: 7.1 g/dL (ref 6.0–8.3)

## 2014-11-01 LAB — CBC WITH DIFFERENTIAL/PLATELET
BASOS ABS: 0 10*3/uL (ref 0.0–0.1)
BASOS PCT: 1 % (ref 0–1)
EOS PCT: 4 % (ref 0–5)
Eosinophils Absolute: 0.2 10*3/uL (ref 0.0–0.7)
HCT: 39.6 % (ref 39.0–52.0)
Hemoglobin: 13.5 g/dL (ref 13.0–17.0)
Lymphocytes Relative: 41 % (ref 12–46)
Lymphs Abs: 1.7 10*3/uL (ref 0.7–4.0)
MCH: 29.8 pg (ref 26.0–34.0)
MCHC: 34.1 g/dL (ref 30.0–36.0)
MCV: 87.4 fL (ref 78.0–100.0)
MONO ABS: 0.6 10*3/uL (ref 0.1–1.0)
MPV: 10.5 fL (ref 8.6–12.4)
Monocytes Relative: 15 % — ABNORMAL HIGH (ref 3–12)
Neutro Abs: 1.6 10*3/uL — ABNORMAL LOW (ref 1.7–7.7)
Neutrophils Relative %: 39 % — ABNORMAL LOW (ref 43–77)
Platelets: 200 10*3/uL (ref 150–400)
RBC: 4.53 MIL/uL (ref 4.22–5.81)
RDW: 13.7 % (ref 11.5–15.5)
WBC: 4.2 10*3/uL (ref 4.0–10.5)

## 2014-11-01 LAB — LIPID PANEL
Cholesterol: 212 mg/dL — ABNORMAL HIGH (ref 0–200)
HDL: 41 mg/dL (ref >=40)
LDL CALC: 138 mg/dL — AB (ref 0–99)
Total CHOL/HDL Ratio: 5.2 Ratio
Triglycerides: 165 mg/dL — ABNORMAL HIGH (ref <150)
VLDL: 33 mg/dL (ref 0–40)

## 2014-11-01 MED ORDER — SPIRONOLACTONE 25 MG PO TABS: 25.0000 mg | ORAL_TABLET | Freq: Every day | ORAL | Status: DC

## 2014-11-01 MED ORDER — GEMFIBROZIL 600 MG PO TABS: 600.0000 mg | ORAL_TABLET | Freq: Two times a day (BID) | ORAL | Status: DC

## 2014-11-01 MED ORDER — CARVEDILOL 25 MG PO TABS: ORAL_TABLET | ORAL | Status: DC

## 2014-11-01 MED ORDER — FUROSEMIDE 40 MG PO TABS: ORAL_TABLET | ORAL | Status: DC

## 2014-11-01 NOTE — Assessment & Plan Note (Signed)
Continue lopid, recheck fasting lipids today, continue to  Maintain low fat diet, weight down 5lbs since last visit

## 2014-11-01 NOTE — Patient Instructions (Signed)
I recommend eye visit once a year I recommend dental visit every 6 months Goal is to  Exercise 30 minutes 5 days a week We will send a letter with lab results  F/U 6 months  

## 2014-11-01 NOTE — Progress Notes (Signed)
Patient ID: Eric Daniel, male   DOB: 1975-07-13, 39 y.o.   MRN: [id number]   Subjective:    Patient ID: Eric Daniel, male    DOB: 11/26/1975, 39 y.o.   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     Review Of Systems:  GEN- denies fatigue, fever, weight loss,weakness, recent illness HEENT- denies eye drainage, change in vision, nasal discharge, CVS- denies chest pain, palpitations RESP- denies SOB, cough, wheeze ABD- denies N/V, change in stools, abd pain GU- denies dysuria, hematuria, dribbling, incontinence MSK- denies joint pain, muscle aches, injury Neuro- denies headache, dizziness, syncope, seizure activity       Objective:    BP 130/78 mmHg  Pulse 72  Temp(Src) 97.5 F (36.4 C) (Oral)  Resp 12  Ht 6\' 3"  (1.905 m)  Wt 252 lb (114.306 kg)  BMI 31.50 kg/m2 GEN- NAD, alert and oriented x3 HEENT- PERRL, EOMI, non injected sclera, pink conjunctiva, MMM, oropharynx clear, TM clear bilat no effusion, canal clear  Neck- Supple, no thyromegaly CVS- RRR, no murmur RESP-CTAB ABD-NABS,soft,NT,ND EXT- No edema Pulses- Radial, DP- 2+        Assessment & Plan:      Problem List Items Addressed This Visit    Routine general medical examination at a health care facility - Primary    CPE done, fasting labs immunizations UTD Reviewed cardiology note Based on height and muscular build I do not think he is obese by BMI standards      Relevant Orders   CBC with Differential/Platelet   Comprehensive metabolic panel   High triglycerides    Continue lopid, recheck fasting lipids today, continue to  Maintain low fat diet, weight down 5lbs since last visit      Relevant Orders   Lipid panel      Note: This dictation was  prepared with Dragon dictation along with smaller phrase technology. Any transcriptional errors that result from this process are unintentional.

## 2014-11-01 NOTE — Assessment & Plan Note (Addendum)
CPE done, fasting labs immunizations UTD Reviewed cardiology note Based on height and muscular build I do not think he is obese by BMI standards

## 2014-11-03 ENCOUNTER — Other Ambulatory Visit: Payer: Self-pay | Admitting: *Deleted

## 2014-11-03 DIAGNOSIS — R748 Abnormal levels of other serum enzymes: Secondary | ICD-10-CM

## 2014-11-05 IMAGING — CT CT ANGIO CHEST
2 of 7 series · 19 of 36 positions shown · IV contrast (APPLIED)
Comparison: None.

CLINICAL DATA: Shortness of breath, hoarseness.

CT ANGIOGRAPHY CHEST
TECHNIQUE: Multidetector CT imaging of the chest using the
standard protocol during bolus administration of intravenous
contrast. Multiplanar reconstructed images including MIPs were
obtained and reviewed to evaluate the vascular anatomy.
Contrast: 80mL OMNIPAQUE IOHEXOL 350 MG/ML SOLN

[Series 6: pulm embolism 1.0 b25f st · axial · 0.73mm/px · z∈[-600,-314]mm · 18 of 320 slices shown]
[im 17/320  lung]
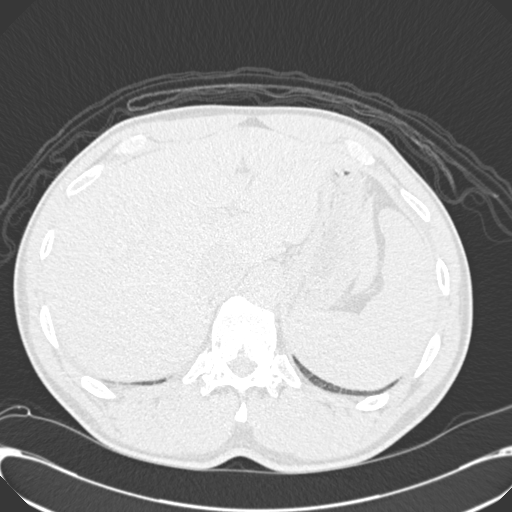
[im 34/320  mediastinal]
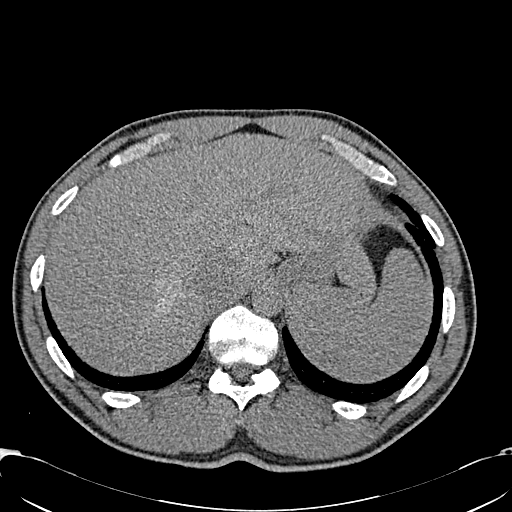
[im 51/320  lung]
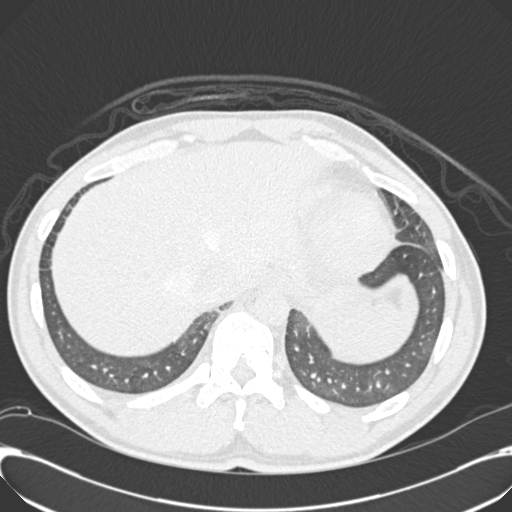
[im 68/320  mediastinal]
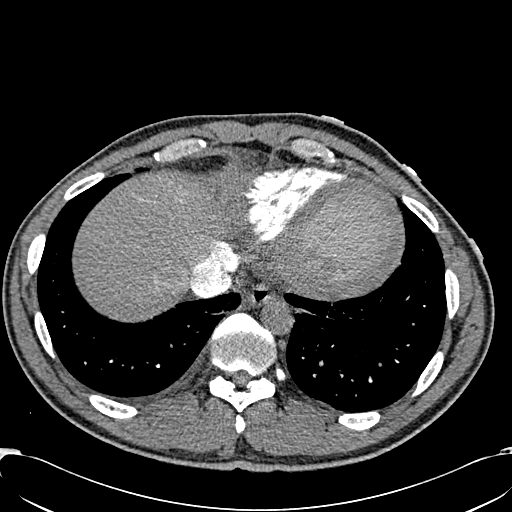
[im 84/320  lung]
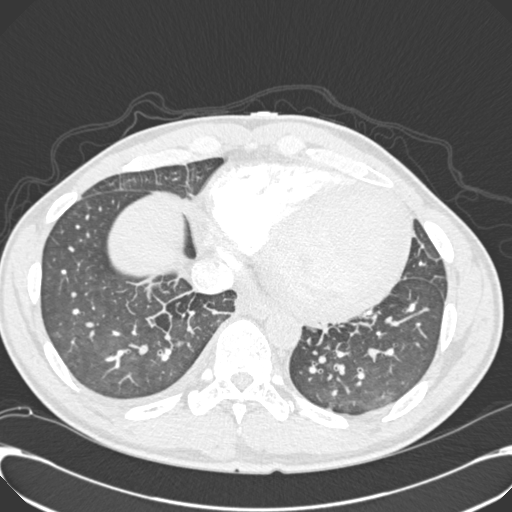
[im 101/320  mediastinal]
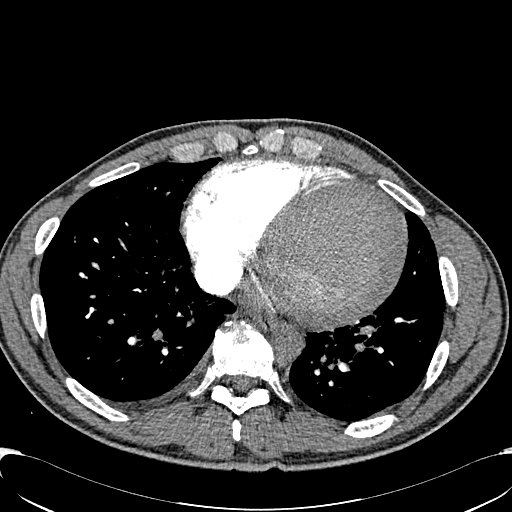
[im 118/320  lung]
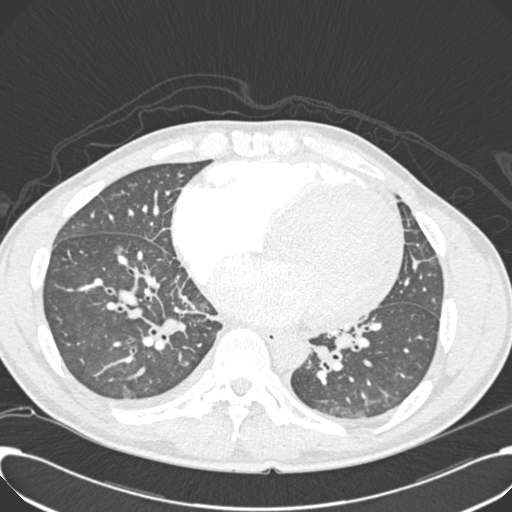
[im 135/320  mediastinal]
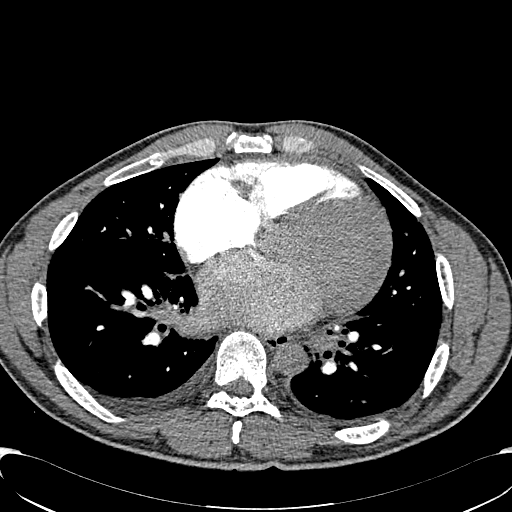
[im 152/320  lung]
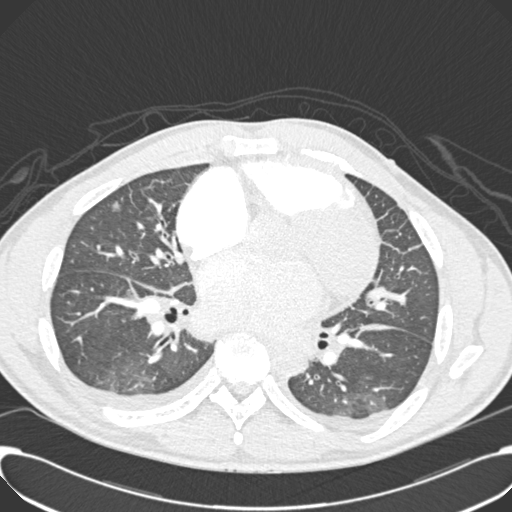
[im 168/320  mediastinal]
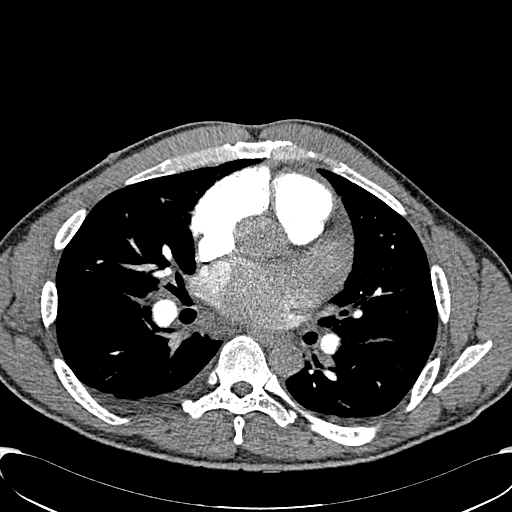
[im 185/320  lung]
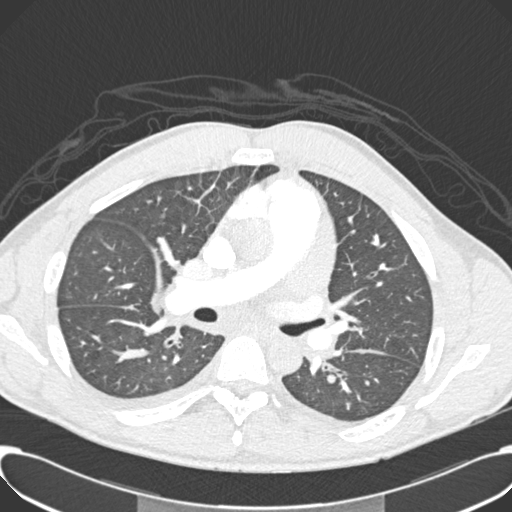
[im 202/320  mediastinal]
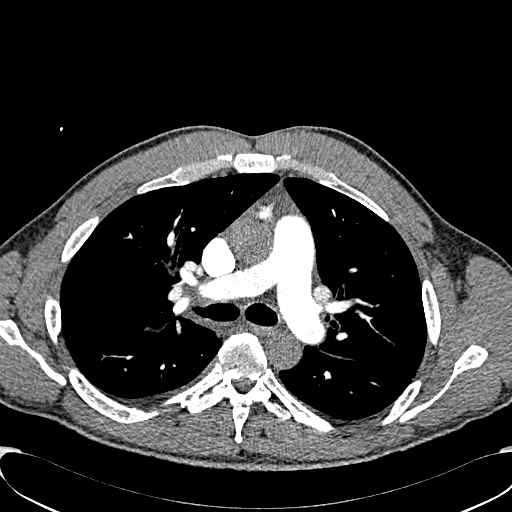
[im 219/320  lung]
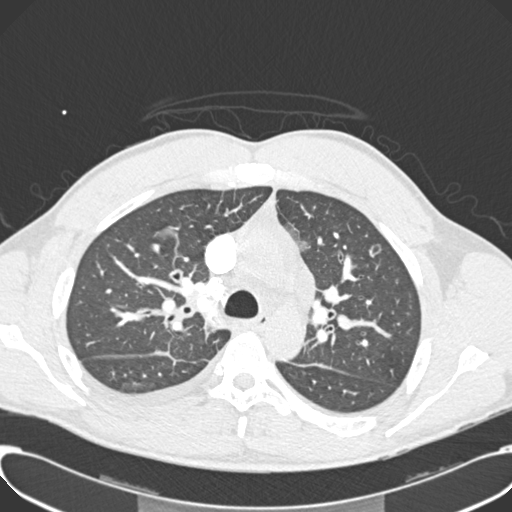
[im 236/320  mediastinal]
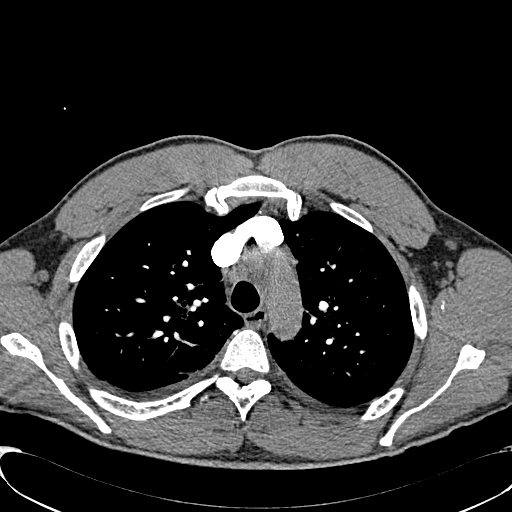
[im 252/320  lung]
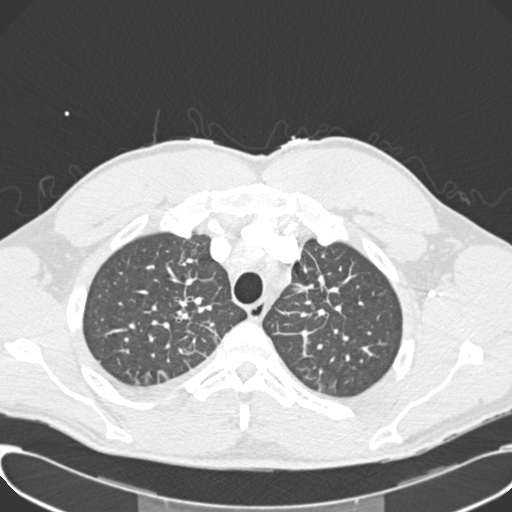
[im 269/320  mediastinal]
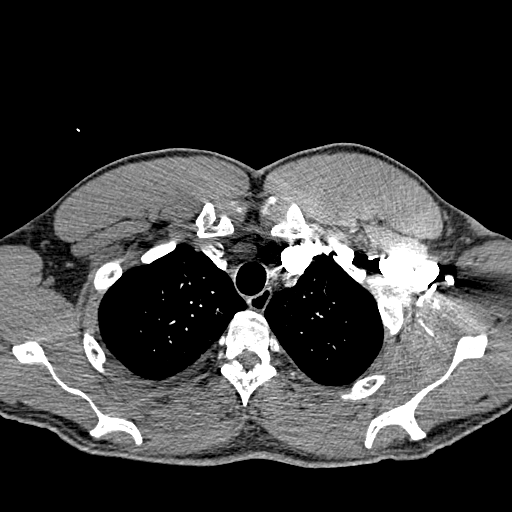
[im 286/320  lung]
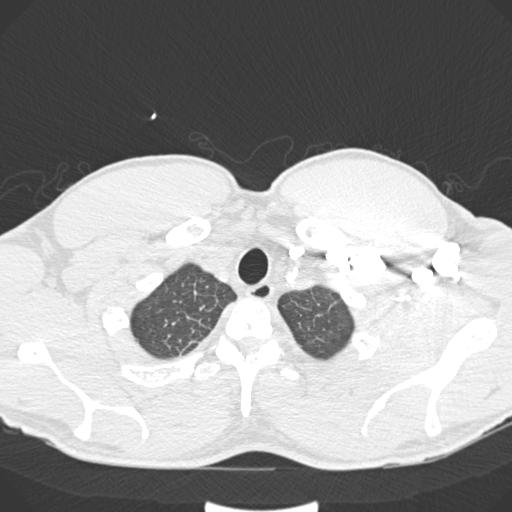
[im 303/320  mediastinal]
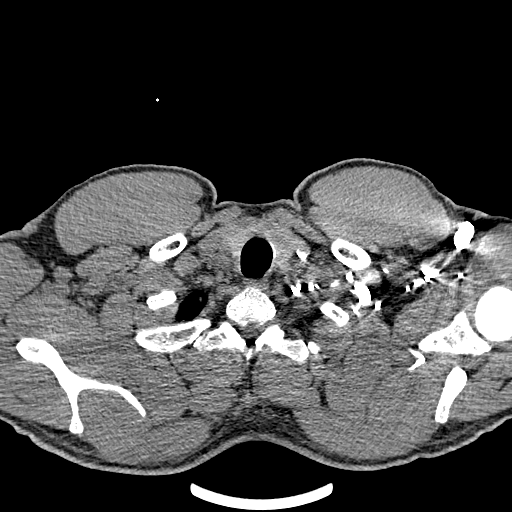

[Series 8: coronals · coronal · 0.67mm/px · 1 of 114 slices shown]
[im 57/114  mediastinal]
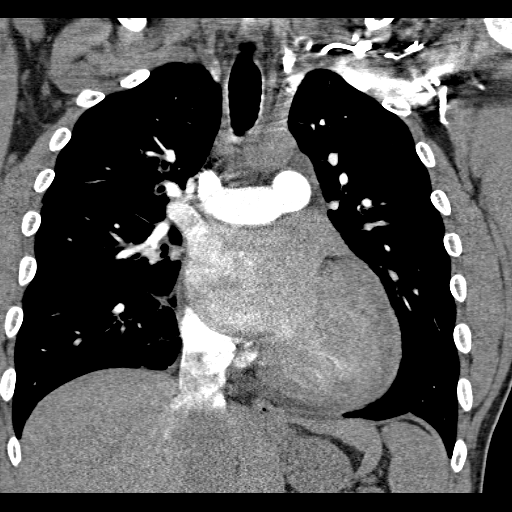

[19 of 36 positions shown; findings below may reference images not displayed]

FINDINGS: There is good contrast opacification of the pulmonary
artery branches.  No discrete filling defect to suggest acute
PE.]There is incomplete opacification of the thoracic aorta, which
is normal in caliber.  There is four-chamber cardiac enlargement.
Small bilateral pleural effusions.  Reflux of contrast from the
right atrium into hepatic veins suggesting a degree of right heart
dysfunction.  14 mm prevascular lymph node, 11 mm precarinal lymph
node, 11 mm pretracheal lymph node.  No hilar adenopathy.  Patchy
ground-glass opacities in basilar segments of both lower lobes,
with septal thickening seen medially in the upper and lower lobes.
No confluent airspace consolidation.  Thoracic spine and sternum
intact.
IMPRESSION: 1.  Negative for acute PE.
2.  Small pleural effusions, septal thickening, contrast reflux
into hepatic veins, and cardiomegaly suggesting CHF.
3.  Nonspecific mediastinal adenopathy.

## 2014-11-15 ENCOUNTER — Encounter: Payer: 59 | Admitting: Family Medicine

## 2015-01-06 ENCOUNTER — Other Ambulatory Visit (HOSPITAL_COMMUNITY): Payer: Self-pay | Admitting: Internal Medicine
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
## 2015-01-28 ENCOUNTER — Ambulatory Visit (INDEPENDENT_AMBULATORY_CARE_PROVIDER_SITE_OTHER): Payer: Self-pay | Admitting: Family Medicine

## 2015-01-28 VITALS — BP 104/70 | HR 63 | Temp 98.1°F | Resp 16 | Ht 75.5 in | Wt 256.6 lb

## 2015-01-28 DIAGNOSIS — Z0000 Encounter for general adult medical examination without abnormal findings: Secondary | ICD-10-CM

## 2015-01-28 DIAGNOSIS — Z021 Encounter for pre-employment examination: Secondary | ICD-10-CM

## 2015-01-28 NOTE — Progress Notes (Addendum)
° °  Subjective:    Patient ID: Eric Daniel, male    DOB: 1975/11/28, 39 y.o.   MRN: [id number] This chart was scribed for Elvina Sidle, MD by Littie Deeds, Medical Scribe. This patient was seen in Room 8 and the patient's care was started at 1:05 PM.   HPI HPI Comments: Eric Daniel is a 39 y.o. male with a history of NSVT and chronic systolic heart failure who presents to the [facility] for a DOT physical exam. He has seen his cardiologist, Dr. Gala Romney within the past year. Patient jogs/walks about 2 miles a day.  Patient coaches high school basketball.  --  Results from echocardiogram on 01/20/14: Study Conclusions  - Left ventricle: The cavity size was severely dilated. Wall thickness was normal. The estimated ejection fraction was 20%. Diffuse hypokinesis. Doppler parameters are consistent with abnormal left ventricular relaxation (grade 1 diastolic dysfunction). - Aortic valve: There was no stenosis. - Left atrium: The atrium was mildly dilated. - Right ventricle: The cavity size was normal. Systolic function was normal. - Right atrium: The atrium was mildly dilated. - Pulmonary arteries: No complete TR doppler jet so unable to estimate PA systolic pressure. - Inferior vena cava: The vessel was normal in size. The respirophasic diameter changes were in the normal range (>= 50%), consistent with normal central venous pressure.  Impressions:  - Severely dilated LV with EF 20%, diffuse hypokinesis. Normal RV size and systolic function. No significant valvular abnormalities.  Review of Systems No chest pain, palpitations, shortness of breath on exertion, edema    Objective:   Physical Exam CONSTITUTIONAL: Well developed/well nourished HEAD: Normocephalic/atraumatic EYES: EOM/PERRL ENMT: Mucous membranes moist NECK: supple no meningeal signs SPINE: entire spine nontender CV:  LUNGS: Lungs are clear to auscultation  bilaterally, no apparent distress ABDOMEN: soft, nontender, no rebound or guarding GU: no cva tenderness NEURO: Pt is awake/alert, moves all extremitiesx4 EXTREMITIES: pulses normal, full ROM SKIN: warm, color normal PSYCH: no abnormalities of mood noted        Assessment & Plan:   By signing my name below, I, Littie Deeds, attest that this documentation has been prepared under the direction and in the presence of Elvina Sidle, MD.  Electronically Signed: Littie Deeds, Medical Scribe. 01/28/2015. 1:03 PM. This chart was scribed in my presence and reviewed by me personally.    ICD-9-CM ICD-10-CM   1. Annual physical exam V70.0 Z00.00    Based on patient's reported history and exercise toleration, along with his regular checkups with Dr. Milas Kocher, I feel patient is qualified for one year certificate requiring visual correction lenses.

## 2015-04-04 ENCOUNTER — Other Ambulatory Visit (HOSPITAL_COMMUNITY): Payer: Self-pay | Admitting: Internal Medicine

## 2015-04-21 MED FILL — CLOBETASOL 0.05% SOLUTION: 0.05 | 30 days supply | Qty: 50 | Fill #1

## 2015-04-29 ENCOUNTER — Ambulatory Visit (HOSPITAL_BASED_OUTPATIENT_CLINIC_OR_DEPARTMENT_OTHER)
Admission: RE | Admit: 2015-04-29 | Discharge: 2015-04-29 | Disposition: A | Discharge status: Home / Self Care | Payer: 59 | Source: Ambulatory Visit | Attending: Internal Medicine | Admitting: Internal Medicine

## 2015-04-29 ENCOUNTER — Ambulatory Visit (HOSPITAL_COMMUNITY)
Admission: RE | Admit: 2015-04-29 | Discharge: 2015-04-29 | Disposition: A | Discharge status: Home / Self Care | Payer: 59 | Source: Ambulatory Visit | Attending: Internal Medicine | Admitting: Internal Medicine

## 2015-04-29 VITALS — BP 102/70 | HR 65 | Wt 257.8 lb

## 2015-04-29 DIAGNOSIS — I071 Rheumatic tricuspid insufficiency: Secondary | ICD-10-CM | POA: Diagnosis not present

## 2015-04-29 DIAGNOSIS — R29898 Other symptoms and signs involving the musculoskeletal system: Secondary | ICD-10-CM | POA: Diagnosis not present

## 2015-04-29 DIAGNOSIS — E785 Hyperlipidemia, unspecified: Secondary | ICD-10-CM | POA: Insufficient documentation

## 2015-04-29 DIAGNOSIS — I517 Cardiomegaly: Secondary | ICD-10-CM | POA: Diagnosis not present

## 2015-04-29 DIAGNOSIS — I5022 Chronic systolic (congestive) heart failure: Secondary | ICD-10-CM

## 2015-04-29 MED ORDER — ATORVASTATIN CALCIUM 20 MG PO TABS: 20.0000 mg | ORAL_TABLET | Freq: Every day | ORAL | Status: DC

## 2015-04-29 MED FILL — ATORVASTATIN 20 MG TABLET: 20 | 90 days supply | Qty: 90 | Fill #0

## 2015-04-29 NOTE — Patient Instructions (Signed)
STOP LOPID START ATORVASTATIN 20 MG,ONE TAB DAILY  Your physician recommends that you schedule a follow-up appointment in:  4 MONTHS  Do the following things EVERYDAY: 1) Weigh yourself in the morning before breakfast. Write it down and keep it in a log. 2) Take your medicines as prescribed 3) Eat low salt foods-Limit salt (sodium) to 2000 mg per day.  4) Stay as active as you can everyday 5) Limit all fluids for the day to less than 2 liters 6)

## 2015-04-29 NOTE — Addendum Note (Signed)
Encounter addended by: Theresia Bough, CMA on: 04/29/2015 10:32 AM<BR>     Documentation filed: Medications, Dx Association, Patient Instructions Section, Orders

## 2015-04-29 NOTE — Progress Notes (Signed)
Advanced Heart Failure Medication Review by a Pharmacist  Does the patient  feel that his/her medications are working for him/her?  yes  Has the patient been experiencing any side effects to the medications prescribed?  no  Does the patient measure his/her own blood pressure or blood glucose at home?  no   Does the patient have any problems obtaining medications due to transportation or finances?   no  Understanding of regimen: good Understanding of indications: good Potential of compliance: good Patient understands to avoid NSAIDs. Patient understands to avoid decongestants.  Issues to address at subsequent visits: None   Pharmacist comments:  Eric Daniel is a pleasant 40 yo M presenting without a medication list but with great recall of his regimen including dosages. He reports good compliance with his medications and did not have any specific medication-related questions or concerns for me at this time.   Tyler Deis. Bonnye Fava, PharmD, BCPS, CPP Clinical Pharmacist Pager: [phone] Phone: [phone] 04/29/2015 10:07 AM      Time with patient: 6 minutes Preparation and documentation time: 2 minutes Total time: 8 minutes

## 2015-04-29 NOTE — Progress Notes (Signed)
Patient ID: Eric Daniel, male   DOB: 02/27/1976, 40 y.o.   MRN: [id number] PCP: Dr. Milinda Antis [facility])  HPI:  Eric Daniel is 40 y/o admitted with systolic HF to NICM (ER 20%) and hypertriglyceridemia (initial TGs 1200 in 7/15). Found to have EF 15% with restrictive diastolic parameters and mild to moderate RV dysfunction. 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 [name]   He was admitted to [facility] 07/06/12 with increased dyspnea on exertion. Pertinent admission labs included Pro BNP 3526, Troponin 0.43>0.39>0.42, creatinine 1.55, Hemoglobin 14.5, TSH 1.46, and potassium 4.9. UDS negative. He is also O+. Discharge weight 216 lbs   Follow up: He returns for follow up.  Feels well.  No orthopnea, PND or edema. Taking lasix M/W/F. Finished cardiac rehab last year. Not very active since. Denies SOB.  Weight is stable.  He is tolerating his meds including Entresto 200 bid.    Echo today reviewed personally EF 25% RV ok   RHC 07/08/13  RA = 13  RV = 62/11/145  PA = 61/36 (47)  PCW = 28  Fick cardiac output/index = 5.0/2.2  Thermo CO/CI 5.8/2.5  SVR = 1292  PVR = 3.3 Woods  FA sat = 96%  PA sat = 64%, 67%   [facility] CPX 08/01/12  Peak VO2: 37.10  VE/VCO2 slope: 22.40  Peak RER: 1.17  CPX 10/23/12 Resting HR: 60 Peak HR: 166 (90% age predicted max HR) BP rest: 111/74 BP peak: 182/109 Peak VO2: 30.7 (85.7% predicted peak VO2) VE/VCO2 slope: 24.8 OUES: 3.50 Peak RER: 1.32  CPX 06/19/2013: Resting HR 64: Peak HR 168 (92% predicted max HR) BP rest 118/80 BP peak: 164/90 Peak VO2: 27.9 (82.4% predicted VO2) VE/VCO2 slope: 26 OUES: 3.24 Peak RER: 1.27 - Mild desaturation during exercise from 99% at rest to 92% peak exercise  CPX 07/26/14 FVC 4.67 (75%)    FEV1 3.76 (74%)     FEV1/FVC 80 (100%)     MVV 218 (118%) Resting HR: 73 Peak HR: 157  (86% age predicted max HR) BP rest: 104/64 BP peak: 168/78  Peak VO2: 28.6 (89.1%  predicted peak VO2) VE/VCO2 slope: 28.8 OUES: 4.64 Peak RER: 1.11 Ventilatory Threshold: 18.0 (56% predicted 63% measured peak VO2) VE/MVV: 51.7% PETCO2 at peak: 40 O2pulse: 23 (109% predicted O2pulse)  ECHO 09/17/12: EF 20-25%; Diffuse hypokinesis. There is severe hypokinesis of the entire inferior myocardium' moderately dilated.  ECHO 04/08/13: EF 15%; Diff HK, grade II DD, RV mod dilated ECHO 01/20/2014 EF 20%  RV normal Grade DD   Labs:  09/17/12 Cr 1.29; BUN 17 10/22/12 Cr 1.5; BUN 16 12/14 Cr 1.42, BUN 15, K+ 4.2 05/2013: Cr 1.31, K+ 4.4, pro-BNP 433.7, dig level 0.7  7/15: K 4.2, creatinine 1.36, TGs 1186 01/22/2014: K 4.2 Creatinine 1.34  02/02/14 K 4.0 Creatinine 1.57 04/30/14 K 3.9 Creatinine 1.34 TC 225 LDL 149 HDL 38 TG 189 7/16: K 4.0 Creatinine 1.42  FH: Father with nonischemic cardiomyopathy and LVAD  SH: No smoking or ETOH. 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   ROS: All systems negative except as listed in HPI, PMH and Problem List.  Past Medical History  Diagnosis Date  . NSVT (nonsustained ventricular tachycardia) (HCC) 07/10/2012  . CHF (congestive heart failure) (HCC) 06/2012  . Hyperlipidemia     Current Outpatient Prescriptions  Medication Sig Dispense Refill  . carvedilol (COREG) 25 MG tablet TAKE 1 TABLET BY  MOUTH 2 TIMES DAILY WITH A MEAL. 60 tablet 6  . digoxin (LANOXIN) 0.125 MG tablet TAKE 1 TABLET BY MOUTH DAILY. 30 tablet 6  . ENTRESTO 97-103 MG TAKE 1 TABLET BY MOUTH 2 TIMES DAILY. 60 tablet 3  . furosemide (LASIX) 40 MG tablet Take  (1 tablet) by mouth once daily on Mondays, Wednesdays, and Fridays only. 15 tablet 6  . gemfibrozil (LOPID) 600 MG tablet Take 1 tablet (600 mg total) by mouth 2 (two) times daily before a meal. 60 tablet 6  . spironolactone (ALDACTONE) 25 MG tablet Take 1 tablet (25 mg total) by mouth daily. 30 tablet 6   No current facility-administered medications for this encounter.   Filed Vitals:    04/29/15 1001  BP: 102/70  Pulse: 65  Weight: 257 lb 12.8 oz (116.937 kg)  SpO2: 96%    PHYSICAL EXAM: General:  Well appearing. No resp difficulty HEENT: normal Neck: supple. JVP flat. Carotids 2+ bilaterally; no bruits. No lymphadenopathy or thryomegaly appreciated. Cor: PMI normal. Regular rate & rhythm. No rubs, gallops or murmurs. Lungs: clear Abdomen: soft, nontender, nondistended. No hepatosplenomegaly. No bruits or masses. Good bowel sounds. Extremities: no cyanosis, clubbing, rash, edema Neuro: alert & orientedx3, cranial nerves grossly intact. Moves all 4 extremities w/o difficulty. Affect pleasant.  ASSESSMENT AND PLAN  1. 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  NYHA I symptoms.  Volume status looks good. CPX reviewed with him today without cardiac limitation.  - Continue lasix as needed  - On goal dose coreg 25 mg BID  - Continue digoxin 0.125 mg daily and spironolactone 25 mg daily. - Continue Entresto to 200  bid.  - Echo reviewed today EF 25%. RV ok.  - No ICD due to class I. We talked about this directly. And CPX confirms Class I.   2. Hyperlipidemia/Hypetriglyceridemia - Followed by PCP - Will switch gemfibrozil to atorva 20. Recheck lipids 3 months  RTC in 4 months.  [name] MD 04/29/2015

## 2015-05-06 ENCOUNTER — Ambulatory Visit: Payer: 59 | Admitting: Family Medicine

## 2015-05-10 ENCOUNTER — Ambulatory Visit: Payer: 59 | Admitting: Family Medicine

## 2015-05-13 ENCOUNTER — Ambulatory Visit (INDEPENDENT_AMBULATORY_CARE_PROVIDER_SITE_OTHER): Payer: 59 | Admitting: Family Medicine

## 2015-05-13 ENCOUNTER — Other Ambulatory Visit (HOSPITAL_COMMUNITY): Payer: Self-pay | Admitting: Internal Medicine

## 2015-05-13 ENCOUNTER — Encounter: Payer: Self-pay | Admitting: Family Medicine

## 2015-05-13 VITALS — BP 128/74 | HR 68 | Temp 97.6°F | Resp 14 | Ht 75.5 in | Wt 255.0 lb

## 2015-05-13 DIAGNOSIS — I5022 Chronic systolic (congestive) heart failure: Secondary | ICD-10-CM

## 2015-05-13 DIAGNOSIS — E781 Pure hyperglyceridemia: Secondary | ICD-10-CM

## 2015-05-13 DIAGNOSIS — I429 Cardiomyopathy, unspecified: Secondary | ICD-10-CM

## 2015-05-13 DIAGNOSIS — K625 Hemorrhage of anus and rectum: Secondary | ICD-10-CM

## 2015-05-13 DIAGNOSIS — I428 Other cardiomyopathies: Secondary | ICD-10-CM

## 2015-05-13 LAB — COMPREHENSIVE METABOLIC PANEL
ALT: 16 U/L (ref 9–46)
AST: 16 U/L (ref 10–40)
Albumin: 4.9 g/dL (ref 3.6–5.1)
Alkaline Phosphatase: 45 U/L (ref 40–115)
BUN: 17 mg/dL (ref 7–25)
CO2: 28 mmol/L (ref 20–31)
Calcium: 9.9 mg/dL (ref 8.6–10.3)
Chloride: 98 mmol/L (ref 98–110)
Creat: 1.31 mg/dL (ref 0.60–1.35)
GLUCOSE: 89 mg/dL (ref 70–99)
Potassium: 4.1 mmol/L (ref 3.5–5.3)
Sodium: 138 mmol/L (ref 135–146)
Total Bilirubin: 1.1 mg/dL (ref 0.2–1.2)
Total Protein: 7.6 g/dL (ref 6.1–8.1)

## 2015-05-13 LAB — LIPID PANEL
Cholesterol: 188 mg/dL (ref 125–200)
HDL: 37 mg/dL — ABNORMAL LOW (ref >=40)
LDL CALC: 102 mg/dL (ref <130)
Total CHOL/HDL Ratio: 5.1 Ratio — ABNORMAL HIGH (ref <=5.0)
Triglycerides: 246 mg/dL — ABNORMAL HIGH (ref <150)
VLDL: 49 mg/dL — AB (ref <30)

## 2015-05-13 LAB — CBC WITH DIFFERENTIAL/PLATELET
BASOS ABS: 0 10*3/uL (ref 0.0–0.1)
Basophils Relative: 0 % (ref 0–1)
EOS ABS: 0.2 10*3/uL (ref 0.0–0.7)
EOS PCT: 3 % (ref 0–5)
HCT: 41 % (ref 39.0–52.0)
Hemoglobin: 14.3 g/dL (ref 13.0–17.0)
Lymphocytes Relative: 29 % (ref 12–46)
Lymphs Abs: 1.6 10*3/uL (ref 0.7–4.0)
MCH: 30.3 pg (ref 26.0–34.0)
MCHC: 34.9 g/dL (ref 30.0–36.0)
MCV: 86.9 fL (ref 78.0–100.0)
MPV: 11.9 fL (ref 8.6–12.4)
Monocytes Absolute: 0.7 10*3/uL (ref 0.1–1.0)
Monocytes Relative: 12 % (ref 3–12)
Neutro Abs: 3.1 10*3/uL (ref 1.7–7.7)
Neutrophils Relative %: 56 % (ref 43–77)
PLATELETS: 192 10*3/uL (ref 150–400)
RBC: 4.72 MIL/uL (ref 4.22–5.81)
RDW: 13.2 % (ref 11.5–15.5)
WBC: 5.6 10*3/uL (ref 4.0–10.5)

## 2015-05-13 MED ORDER — HYDROCORTISONE ACE-PRAMOXINE 1-1 % RE CREA: 1.0000 "application " | TOPICAL_CREAM | Freq: Two times a day (BID) | RECTAL | Status: DC

## 2015-05-13 MED ORDER — SPIRONOLACTONE 25 MG PO TABS: 25.0000 mg | ORAL_TABLET | Freq: Every day | ORAL | Status: DC

## 2015-05-13 MED ORDER — CARVEDILOL 25 MG PO TABS: ORAL_TABLET | ORAL | Status: DC

## 2015-05-13 MED FILL — ENTRESTO 97 MG-103 MG TAB: 97-103 | 30 days supply | Qty: 60 | Fill #0

## 2015-05-13 MED FILL — CARVEDILOL 25 MG TABLET: 25 | 30 days supply | Qty: 60 | Fill #2

## 2015-05-13 MED FILL — DIGITEK 125 MCG TABLET: 125 | 30 days supply | Qty: 30 | Fill #1

## 2015-05-13 MED FILL — SPIRONOLACTONE 25 MG TABLET: 25 | 30 days supply | Qty: 30 | Fill #2

## 2015-05-13 MED FILL — FUROSEMIDE 40 MG TABLET: 40 | 76 days supply | Qty: 33 | Fill #2

## 2015-05-13 MED FILL — PRAMCORT 1% CREAM: 1-1 | 5 days supply | Qty: 30 | Fill #0

## 2015-05-13 NOTE — Progress Notes (Signed)
Patient ID: Eric Daniel, male   DOB: July 25, 1975, 40 y.o.   MRN: [id number]    Subjective:    Patient ID: Eric Daniel, male    DOB: [birth year], 40 y.o.   MRN: [id number]  Patient presents for 6 month F/U and Cerumen Impaction  patient follow-up chronic medical problems and fasting labs for his hypertriglyceridemia. He is followed by cardiology and a regular basis. He is also felt like he had some ear wax   Past few weeks noted some blood when he wiped, admits to straining with BM, no pain or burning sensation, no black tarry stools. Last ween 1 week ago, no blood thinners     Review Of Systems:  GEN- denies fatigue, fever, weight loss,weakness, recent illness HEENT- denies eye drainage, change in vision, nasal discharge, CVS- denies chest pain, palpitations RESP- denies SOB, cough, wheeze ABD- denies N/V, change in stools, abd pain GU- denies dysuria, hematuria, dribbling, incontinence MSK- denies joint pain, muscle aches, injury Neuro- denies headache, dizziness, syncope, seizure activity       Objective:    BP 128/74 mmHg  Pulse 68  Temp(Src) 97.6 F (36.4 C) (Oral)  Resp 14  Ht 6' 3.5" (1.918 m)  Wt 255 lb (115.667 kg)  BMI 31.44 kg/m2 GEN- NAD, alert and oriented x3 HEENT- PERRL, EOMI, non injected sclera, pink conjunctiva, MMM, oropharynx clear Neck- Supple, no thyromegaly CVS- RRR, no murmur RESP-CTAB ABD-NABS,soft,NT,ND GU- normal tone, FOBT negative, soft stool, prostate smooth and large, ? Small hemorrhoid palpated 6 o clock position EXT- No edema Pulses- Radial  2+        Assessment & Plan:      Problem List Items Addressed This Visit    Nonischemic cardiomyopathy ? familial-restrictive   Relevant Medications   spironolactone (ALDACTONE) 25 MG tablet   carvedilol (COREG) 25 MG tablet   High triglycerides - Primary   Relevant Medications   spironolactone (ALDACTONE) 25 MG tablet   carvedilol (COREG) 25 MG tablet   Other Relevant Orders   Lipid panel   Chronic systolic heart failure [facility])    Reviewed cardiology note Fasting labs done Recently placed on statin drug        Relevant Medications   spironolactone (ALDACTONE) 25 MG tablet   carvedilol (COREG) 25 MG tablet   Other Relevant Orders   CBC with Differential/Platelet   Comprehensive metabolic panel    Other Visit Diagnoses    Rectal bleeding        2/2 strain with BM,likley internal  hemorrhoids, given anulpram to use, increase fiber, if this persist then obtain colonoscopy       Note: This dictation was prepared with Dragon dictation along with smaller phrase technology. Any transcriptional errors that result from this process are unintentional.

## 2015-05-13 NOTE — Assessment & Plan Note (Signed)
Now on Lipitor.

## 2015-05-13 NOTE — Assessment & Plan Note (Signed)
Reviewed cardiology note Fasting labs done Recently placed on statin drug

## 2015-05-13 NOTE — Patient Instructions (Signed)
Continue current meds We will call with lab results F/U July for Physical

## 2015-06-13 MED FILL — CARVEDILOL 25 MG TABLET: 25 | 30 days supply | Qty: 60 | Fill #0

## 2015-06-13 MED FILL — DIGITEK 125 MCG TABLET: 125 | 30 days supply | Qty: 30 | Fill #2

## 2015-06-13 MED FILL — SPIRONOLACTONE 25 MG TABLET: 25 | 90 days supply | Qty: 90 | Fill #0

## 2015-06-13 MED FILL — ENTRESTO 97 MG-103 MG TAB: 97-103 | 30 days supply | Qty: 60 | Fill #1

## 2015-07-08 ENCOUNTER — Ambulatory Visit (INDEPENDENT_AMBULATORY_CARE_PROVIDER_SITE_OTHER): Payer: 59 | Admitting: Family Medicine

## 2015-07-08 VITALS — BP 122/78 | HR 66 | Temp 98.0°F | Resp 14 | Ht 75.5 in | Wt 260.0 lb

## 2015-07-08 DIAGNOSIS — M545 Low back pain, unspecified: Secondary | ICD-10-CM

## 2015-07-08 MED ORDER — CYCLOBENZAPRINE HCL 10 MG PO TABS: 10.0000 mg | ORAL_TABLET | Freq: Two times a day (BID) | ORAL | Status: DC | PRN

## 2015-07-08 MED ORDER — OXYCODONE-ACETAMINOPHEN 5-325 MG PO TABS: 1.0000 | ORAL_TABLET | Freq: Three times a day (TID) | ORAL | Status: DC | PRN

## 2015-07-08 MED FILL — OXYCODONE/APAP 5-325: 5-325 | 7 days supply | Qty: 20 | Fill #0

## 2015-07-08 MED FILL — CLOBETASOL 0.05% SOLUTION: 0.05 | 30 days supply | Qty: 50 | Fill #2

## 2015-07-08 MED FILL — CYCLOBENZAPRINE 10 MG TAB: 10 | 15 days supply | Qty: 30 | Fill #0

## 2015-07-08 NOTE — Progress Notes (Signed)
Patient ID: Eric Daniel, male   DOB: Oct 02, 1975, 40 y.o.   MRN: [id number]    Subjective:    Patient ID: Eric Daniel, male    DOB: April 21, 1975, 40 y.o.   MRN: [id number]  Patient presents for Lumbar Pain Patient with low back pain for the past 2 months. He does not remember any particular injury. He did try stretching he also tried an over-the-counter Tylenol. He is not supposed to be on anti-inflammatories high doses because of his cardiac history. He did take an old pain pill that he had at home oxycodone does help some. He is not trying to stay active. He denies any radiating symptoms down the legs denies any tingling numbness in his feet denies any change in bowel or bladder.    Review Of Systems:  GEN- denies fatigue, fever, weight loss,weakness, recent illness HEENT- denies eye drainage, change in vision, nasal discharge, CVS- denies chest pain, palpitations RESP- denies SOB, cough, wheeze ABD- denies N/V, change in stools, abd pain GU- denies dysuria, hematuria, dribbling, incontinence MSK- + joint pain, muscle aches, injury Neuro- denies headache, dizziness, syncope, seizure activity       Objective:    BP 122/78 mmHg  Pulse 66  Temp(Src) 98 F (36.7 C) (Oral)  Resp 14  Ht 6' 3.5" (1.918 m)  Wt 260 lb (117.935 kg)  BMI 32.06 kg/m2 GEN- NAD, alert and oriented x3 MSK- TTP Lumbar paraspinals mild TTP lumbar spine, fair ROM, pain with flexion, neg SLR, fair ROM hips- seems stiff.  Neuro- strength equal bilat LE, normal tone , DTR symmetric, normal gait  EXT- No edema Pulses- DP 2+        Assessment & Plan:      Problem List Items Addressed This Visit    None    Visit Diagnoses    Bilateral low back pain without sciatica    -  Primary    More MSK pain, stiffiness, given flexeril, percocet, advised massage or PT, he opted for massage. Discussed xray if no improvement with meds     Relevant Medications    cyclobenzaprine (FLEXERIL) 10 MG tablet    oxyCODONE-acetaminophen (ROXICET) 5-325 MG tablet    Other Relevant Orders    DG Lumbar Spine Complete       Note: This dictation was prepared with Dragon dictation along with smaller phrase technology. Any transcriptional errors that result from this process are unintentional.

## 2015-07-08 NOTE — Patient Instructions (Signed)
Take pain medicine as prescribed Muscle relaxer  Get xray if not better next week  Get massage F/U as needed

## 2015-07-11 ENCOUNTER — Ambulatory Visit: Payer: 59 | Admitting: Family Medicine

## 2015-07-18 MED FILL — DIGITEK 125 MCG TABLET: 125 | 30 days supply | Qty: 30 | Fill #3

## 2015-07-18 MED FILL — ATORVASTATIN 20 MG TABLET: 20 | 90 days supply | Qty: 90 | Fill #1

## 2015-07-18 MED FILL — ENTRESTO 97 MG-103 MG TAB: 97-103 | 30 days supply | Qty: 60 | Fill #2

## 2015-07-29 ENCOUNTER — Other Ambulatory Visit: Payer: Self-pay | Admitting: Family Medicine

## 2015-07-29 MED FILL — CARVEDILOL 25 MG TABLET: 25 | 30 days supply | Qty: 60 | Fill #1

## 2015-07-29 MED FILL — FUROSEMIDE 40 MG TABLET: 40 | 30 days supply | Qty: 15 | Fill #0

## 2015-07-29 NOTE — Telephone Encounter (Signed)
Refill appropriate and filled per protocol. 

## 2015-08-17 MED FILL — ENTRESTO 97 MG-103 MG TAB: 97-103 | 30 days supply | Qty: 60 | Fill #3

## 2015-08-17 MED FILL — DIGITEK 125 MCG TABLET: 125 | 30 days supply | Qty: 30 | Fill #4

## 2015-09-06 MED FILL — CARVEDILOL 25 MG TABLET: 25 | 30 days supply | Qty: 60 | Fill #2

## 2015-09-08 ENCOUNTER — Other Ambulatory Visit (HOSPITAL_COMMUNITY): Payer: Self-pay | Admitting: *Deleted

## 2015-09-08 MED ORDER — SACUBITRIL-VALSARTAN 97-103 MG PO TABS: ORAL_TABLET | ORAL | Status: DC

## 2015-09-08 MED FILL — DIGITEK 125 MCG TABLET: 125 | 30 days supply | Qty: 30 | Fill #5

## 2015-09-08 MED FILL — FUROSEMIDE 40 MG TABLET: 40 | 30 days supply | Qty: 15 | Fill #1

## 2015-09-09 MED FILL — ENTRESTO 97 MG-103 MG TAB: 97-103 | 30 days supply | Qty: 60 | Fill #0

## 2015-10-05 ENCOUNTER — Encounter (HOSPITAL_COMMUNITY): Payer: Self-pay

## 2015-10-05 MED FILL — SPIRONOLACTONE 25 MG TABLET: 25 | 90 days supply | Qty: 90 | Fill #1

## 2015-10-05 MED FILL — CARVEDILOL 25 MG TABLET: 25 | 30 days supply | Qty: 60 | Fill #3

## 2015-10-05 NOTE — Progress Notes (Signed)
Received disability claim forms from [employer] on behalf of patient. Per Dr. Prescott Gum last OV note and recent CPX study, patient is Class I, asymptomatic, stable, and on goal dose medications. 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 Will place in Dr. Minda Ditto file to review personally and complete.  Ave Filter

## 2015-10-17 ENCOUNTER — Encounter (HOSPITAL_COMMUNITY): Payer: Medicare Other | Admitting: Internal Medicine

## 2015-10-19 MED FILL — DIGITEK 125 MCG TABLET: 125 | 30 days supply | Qty: 30 | Fill #6

## 2015-10-19 MED FILL — ENTRESTO 97 MG-103 MG TAB: 97-103 | 30 days supply | Qty: 60 | Fill #1

## 2015-10-19 MED FILL — ATORVASTATIN 20 MG TABLET: 20 | 90 days supply | Qty: 90 | Fill #2

## 2015-10-24 ENCOUNTER — Encounter (HOSPITAL_COMMUNITY): Payer: Self-pay

## 2015-10-24 ENCOUNTER — Other Ambulatory Visit (HOSPITAL_COMMUNITY): Payer: Self-pay | Admitting: *Deleted

## 2015-10-24 NOTE — Progress Notes (Signed)
Disability Status Update forms completed and signed by Dr. Gala Romney and faxed to [employer] at provided # [id number]. Claim was due to be faxed by 10/01/15, however with the transition of returning to completing our own patient forms in CHF clinic instead of [employer] doing them, the paperwork got delayed in completion. Note attached to explain this delay sent also. 19 total pages sent. Copy of note with forms scanned into patient's electronic medical record.  Ave Filter

## 2015-11-10 MED FILL — CARVEDILOL 25 MG TABLET: 25 | 30 days supply | Qty: 60 | Fill #4

## 2015-11-10 MED FILL — FUROSEMIDE 40 MG TABLET: 40 | 30 days supply | Qty: 15 | Fill #2

## 2015-11-15 ENCOUNTER — Other Ambulatory Visit (HOSPITAL_COMMUNITY): Payer: Self-pay | Admitting: *Deleted

## 2015-11-15 MED ORDER — DIGOXIN 125 MCG PO TABS: 125.0000 ug | ORAL_TABLET | Freq: Every day | ORAL | 6 refills | Status: DC

## 2015-11-15 MED FILL — DIGITEK 125 MCG TABLET: 125 | 30 days supply | Qty: 30 | Fill #0

## 2015-11-18 ENCOUNTER — Ambulatory Visit (HOSPITAL_COMMUNITY)
Admission: RE | Admit: 2015-11-18 | Discharge: 2015-11-18 | Disposition: A | Discharge status: Home / Self Care | Payer: 59 | Source: Ambulatory Visit | Attending: Internal Medicine | Admitting: Internal Medicine

## 2015-11-18 VITALS — BP 128/72 | HR 68 | Wt 258.0 lb

## 2015-11-18 DIAGNOSIS — E781 Pure hyperglyceridemia: Secondary | ICD-10-CM | POA: Diagnosis not present

## 2015-11-18 DIAGNOSIS — R0609 Other forms of dyspnea: Secondary | ICD-10-CM | POA: Diagnosis not present

## 2015-11-18 DIAGNOSIS — I5022 Chronic systolic (congestive) heart failure: Secondary | ICD-10-CM | POA: Insufficient documentation

## 2015-11-18 DIAGNOSIS — I429 Cardiomyopathy, unspecified: Secondary | ICD-10-CM | POA: Insufficient documentation

## 2015-11-18 DIAGNOSIS — I472 Ventricular tachycardia: Secondary | ICD-10-CM | POA: Diagnosis not present

## 2015-11-18 DIAGNOSIS — E785 Hyperlipidemia, unspecified: Secondary | ICD-10-CM | POA: Insufficient documentation

## 2015-11-18 LAB — LIPID PANEL
CHOL/HDL RATIO: 4.9 ratio
Cholesterol: 163 mg/dL (ref 0–200)
HDL: 33 mg/dL — AB (ref >40)
LDL Cholesterol: 69 mg/dL (ref 0–99)
Triglycerides: 305 mg/dL — ABNORMAL HIGH (ref <150)
VLDL: 61 mg/dL — AB (ref 0–40)

## 2015-11-18 LAB — BASIC METABOLIC PANEL
ANION GAP: 6 (ref 5–15)
BUN: 11 mg/dL (ref 6–20)
CHLORIDE: 103 mmol/L (ref 101–111)
CO2: 29 mmol/L (ref 22–32)
Calcium: 9.6 mg/dL (ref 8.9–10.3)
Creatinine, Ser: 1.33 mg/dL — ABNORMAL HIGH (ref 0.61–1.24)
GFR calc Af Amer: >60 mL/min (ref >60)
GLUCOSE: 103 mg/dL — AB (ref 65–99)
POTASSIUM: 3.9 mmol/L (ref 3.5–5.1)
Sodium: 138 mmol/L (ref 135–145)

## 2015-11-18 NOTE — Progress Notes (Signed)
Advanced Heart Failure Medication Review by a Pharmacist  Does the patient  feel that his/her medications are working for him/her?  yes  Has the patient been experiencing any side effects to the medications prescribed?  no  Does the patient measure his/her own blood pressure or blood glucose at home?  no   Does the patient have any problems obtaining medications due to transportation or finances?   no  Understanding of regimen: good Understanding of indications: good Potential of compliance: good Patient understands to avoid NSAIDs. Patient understands to avoid decongestants.  Issues to address at subsequent visits: None   Pharmacist comments:  Eric Daniel is a pleasant 40 yo M presenting without a medication list but with good recall of his regimen. He reports good compliance with his regimen and did not have any specific medication-related questions or concerns at this time.   Eric Daniel. Eric Daniel, PharmD, BCPS, CPP Clinical Pharmacist Pager: [phone] Phone: [phone] 11/18/2015 11:03 AM      Time with patient: 10 minutes Preparation and documentation time: 2 minutes Total time: 12 minutes

## 2015-11-18 NOTE — Progress Notes (Signed)
Patient ID: Eric Daniel, male   DOB: 03/03/1976, 40 y.o.   MRN: [id number]    HPI:  Eric Daniel is 40 y/o admitted with systolic HF to NICM (ER 20%) and hypertriglyceridemia (initial TGs 1200 in 7/15). Found to have EF 15% with restrictive diastolic parameters and mild to moderate RV dysfunction. 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 [name]   He was admitted to [facility] 07/06/12 with increased dyspnea on exertion. Pertinent admission labs included Pro BNP 3526, Troponin 0.43>0.39>0.42, creatinine 1.55, Hemoglobin 14.5, TSH 1.46, and potassium 4.9. UDS negative. He is also O+. Discharge weight 216 lbs   Follow up: He returns for follow up.  Overall feeling good. Denies SOB/PND/Orthopnea. Taking lasix about 3 days a week. Busy coaching basketball. Taking all medications.   Echo 04/2015  EF  15-20% RV ok   RHC 07/08/13  RA = 13  RV = 62/11/145  PA = 61/36 (47)  PCW = 28  Fick cardiac output/index = 5.0/2.2  Thermo CO/CI 5.8/2.5  SVR = 1292  PVR = 3.3 Woods  FA sat = 96%  PA sat = 64%, 67%   [facility] CPX 08/01/12  Peak VO2: 37.10  VE/VCO2 slope: 22.40  Peak RER: 1.17  CPX 10/23/12 Resting HR: 60 Peak HR: 166 (90% age predicted max HR) BP rest: 111/74 BP peak: 182/109 Peak VO2: 30.7 (85.7% predicted peak VO2) VE/VCO2 slope: 24.8 OUES: 3.50 Peak RER: 1.32  CPX 06/19/2013: Resting HR 64: Peak HR 168 (92% predicted max HR) BP rest 118/80 BP peak: 164/90 Peak VO2: 27.9 (82.4% predicted VO2) VE/VCO2 slope: 26 OUES: 3.24 Peak RER: 1.27 - Mild desaturation during exercise from 99% at rest to 92% peak exercise  CPX 07/26/14 FVC 4.67 (75%)    FEV1 3.76 (74%)     FEV1/FVC 80 (100%)     MVV 218 (118%) Resting HR: 73 Peak HR: 157  (86% age predicted max HR) BP rest: 104/64 BP peak: 168/78  Peak VO2: 28.6 (89.1% predicted peak VO2) VE/VCO2 slope: 28.8 OUES: 4.64 Peak RER: 1.11 Ventilatory Threshold: 18.0 (56% predicted 63% measured  peak VO2) VE/MVV: 51.7% PETCO2 at peak: 40 O2pulse: 23 (109% predicted O2pulse)  ECHO 09/17/12: EF 20-25%; Diffuse hypokinesis. There is severe hypokinesis of the entire inferior myocardium' moderately dilated.  ECHO 04/08/13: EF 15%; Diff HK, grade II DD, RV mod dilated ECHO 01/20/2014 EF 20%  RV normal Grade DD  Labs:  09/17/12 Cr 1.29; BUN 17 10/22/12 Cr 1.5; BUN 16 12/14 Cr 1.42, BUN 15, K+ 4.2 05/2013: Cr 1.31, K+ 4.4, pro-BNP 433.7, dig level 0.7  7/15: K 4.2, creatinine 1.36, TGs 1186 01/22/2014: K 4.2 Creatinine 1.34  02/02/14 K 4.0 Creatinine 1.57 04/30/14 K 3.9 Creatinine 1.34 TC 225 LDL 149 HDL 38 TG 189 7/16: K 4.0 Creatinine 1.42 05/13/2015: 1.31  FH: Father with nonischemic cardiomyopathy and LVAD  SH: No smoking or ETOH. 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   ROS: All systems negative except as listed in HPI, PMH and Problem List.  Past Medical History:  Diagnosis Date  . CHF (congestive heart failure) (HCC) 06/2012  . Hyperlipidemia   . NSVT (nonsustained ventricular tachycardia) (HCC) 07/10/2012    Current Outpatient Prescriptions  Medication Sig Dispense Refill  . atorvastatin (LIPITOR) 20 MG tablet Take 1 tablet (20 mg total) by mouth daily. 90 tablet 3  . carvedilol (COREG) 25 MG tablet TAKE 1 TABLET BY MOUTH 2 TIMES DAILY WITH A MEAL.  60 tablet 6  . digoxin (LANOXIN) 0.125 MG tablet Take 1 tablet (125 mcg total) by mouth daily. 30 tablet 6  . furosemide (LASIX) 40 MG tablet TAKE 1 TABLET BY MOUTH ONCE DAILY ON MONDAYS, WEDNESDAYS, AND FRIDAYS ONLY. 15 tablet 6  . sacubitril-valsartan (ENTRESTO) 97-103 MG TAKE 1 TABLET BY MOUTH 2 TIMES DAILY. 60 tablet 3  . spironolactone (ALDACTONE) 25 MG tablet Take 1 tablet (25 mg total) by mouth daily. 30 tablet 6  . cyclobenzaprine (FLEXERIL) 10 MG tablet Take 1 tablet (10 mg total) by mouth 2 (two) times daily as needed for muscle spasms. (Patient not taking: Reported on 11/18/2015) 30 tablet 0  .  oxyCODONE-acetaminophen (ROXICET) 5-325 MG tablet Take 1 tablet by mouth every 8 (eight) hours as needed for severe pain. (Patient not taking: Reported on 11/18/2015) 20 tablet 0   No current facility-administered medications for this encounter.    Vitals:   11/18/15 1036  BP: 128/72  BP Location: Right Arm  Patient Position: Sitting  Cuff Size: Normal  Pulse: 68  SpO2: 98%  Weight: 258 lb (117 kg)    PHYSICAL EXAM: General:  Well appearing. No resp difficulty HEENT: normal Neck: supple. JVP flat. Carotids 2+ bilaterally; no bruits. No lymphadenopathy or thryomegaly appreciated. Cor: PMI normal. Regular rate & rhythm. No rubs, gallops or murmurs. Lungs: clear Abdomen: soft, nontender, nondistended. No hepatosplenomegaly. No bruits or masses. Good bowel sounds. Extremities: no cyanosis, clubbing, rash, edema Neuro: alert & orientedx3, cranial nerves grossly intact. Moves all 4 extremities w/o difficulty. Affect pleasant.  ASSESSMENT AND PLAN  1. 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  NYHA I symptoms.    - Continue lasix as needed  - On goal dose coreg 25 mg BID  - Continue digoxin 0.125 mg daily and spironolactone 25 mg daily. - Continue Entresto to 97/103  bid.  - No ICD due to class I. We talked about this directly. And CPX confirms Class I.  2. Hyperlipidemia/Hypetriglyceridemia - Followed by PCP. Continue atorva 20. Check lipids 3 months  RTC in 4 months.  Amy Clegg NP-C  11/18/2015  Patient seen and examined with Tonye Becket, NP. We discussed all aspects of the encounter. I agree with the assessment and plan as stated above.   Doing well despite severe LV dysfunction. NYHA I and volume status ok. On good meds. Will need repeat CPX in near future.check labs today.

## 2015-11-18 NOTE — Patient Instructions (Signed)
Labs today  Your physician recommends that you schedule a follow-up appointment in: 6 months with Dr Gala Romney

## 2015-11-22 MED FILL — ENTRESTO 97 MG-103 MG TAB: 97-103 | 30 days supply | Qty: 60 | Fill #2

## 2015-12-02 ENCOUNTER — Encounter: Payer: Self-pay | Admitting: Family Medicine

## 2015-12-02 DIAGNOSIS — M79642 Pain in left hand: Secondary | ICD-10-CM

## 2015-12-08 MED FILL — CARVEDILOL 25 MG TABLET: 25 | 30 days supply | Qty: 60 | Fill #5

## 2015-12-14 MED FILL — SPIRONOLACTONE 25 MG TABLET: 25 | 30 days supply | Qty: 30 | Fill #2

## 2015-12-14 MED FILL — DIGITEK 125 MCG TABLET: 125 | 30 days supply | Qty: 30 | Fill #1

## 2015-12-14 MED FILL — FUROSEMIDE 40 MG TABLET: 40 | 30 days supply | Qty: 15 | Fill #3

## 2015-12-15 MED FILL — ENTRESTO 97 MG-103 MG TAB: 97-103 | 30 days supply | Qty: 60 | Fill #3

## 2016-01-16 ENCOUNTER — Other Ambulatory Visit (HOSPITAL_COMMUNITY): Payer: Self-pay | Admitting: Internal Medicine

## 2016-01-16 ENCOUNTER — Other Ambulatory Visit: Payer: Self-pay | Admitting: Family Medicine

## 2016-01-16 DIAGNOSIS — I5022 Chronic systolic (congestive) heart failure: Secondary | ICD-10-CM

## 2016-01-16 MED FILL — ENTRESTO 97 MG-103 MG TAB: 97-103 | 30 days supply | Qty: 60 | Fill #0

## 2016-01-16 MED FILL — ATORVASTATIN 20 MG TABLET: 20 | 90 days supply | Qty: 90 | Fill #3

## 2016-01-16 MED FILL — DIGITEK 125 MCG TABLET: 125 | 30 days supply | Qty: 30 | Fill #2

## 2016-01-16 MED FILL — SPIRONOLACTONE 25 MG TABLET: 25 | 90 days supply | Qty: 90 | Fill #0

## 2016-01-16 MED FILL — CARVEDILOL 25 MG TABLET: 25 | 30 days supply | Qty: 60 | Fill #6

## 2016-01-16 MED FILL — FUROSEMIDE 40 MG TABLET: 40 | 30 days supply | Qty: 15 | Fill #4

## 2016-02-02 ENCOUNTER — Ambulatory Visit (INDEPENDENT_AMBULATORY_CARE_PROVIDER_SITE_OTHER): Payer: Self-pay | Admitting: Physician Assistant

## 2016-02-02 VITALS — BP 124/80 | HR 50 | Temp 97.6°F | Resp 17 | Ht 75.5 in | Wt 261.0 lb

## 2016-02-02 DIAGNOSIS — Z0289 Encounter for other administrative examinations: Secondary | ICD-10-CM

## 2016-02-02 NOTE — Progress Notes (Signed)
[facility] [address] [place] [address] [phone]  By signing my name below, I, Stann Ore, attest that this documentation has been prepared under the direction and in the presence of Stephanie English, PA-C. Electronically Signed: Stann Ore, Scribe. 02/02/2016 , 12:32 PM .  Patient was seen in Room 12 .  Date:  02/02/2016   Name:  Eric Daniel   DOB:  [birth year]   MRN:  [id number]  PCP:  Milinda Antis, MD    History of Present Illness: Chief Complaint  Patient presents with   Annual Exam    DOT     Eric Daniel is a 40 y.o. 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 His cardiologist is Dr. Gala Romney, last seen in July. He follows up with cardiologist once a year. His last echocardiogram was done in January 2017, which showed LV EF 15%-20%. He denies chest pain, shortness of breath or palpitations. He denies alcohol use, or tobacco use. He has been hospitalized in 2012 for his heart problems.   He mentions his last DOT card was a 1-year card, certified by Dr. Milus Glazier on 01/28/2015.   Patient Active Problem List   Diagnosis Date Noted   High triglycerides 04/30/2014   Routine general medical examination at a health care facility 10/27/2013   Chronic systolic heart failure (HCC) 10/12/2013   Nonischemic cardiomyopathy ? familial-restrictive 10/22/2012   NSVT (nonsustained ventricular tachycardia) (HCC) 07/10/2012   Abnormal EKG- LAFB PRWP, BAE 07/06/2012   Family history of cardiomyopathy- his father just recieved a VAD 07/06/2012    Past Medical History:  Diagnosis Date   CHF (congestive heart failure) (HCC) 06/2012   Hyperlipidemia    NSVT (nonsustained ventricular tachycardia) (HCC) 07/10/2012    Past Surgical History:  Procedure Laterality Date   LEFT AND RIGHT HEART CATHETERIZATION WITH CORONARY ANGIOGRAM N/A 07/08/2012   Procedure: LEFT AND RIGHT HEART CATHETERIZATION WITH  CORONARY ANGIOGRAM;  Surgeon: Dolores Patty, MD;  Location: [facility] CATH LAB;  Service: Cardiovascular;  Laterality: N/A;    Social History  Substance Use Topics   Smoking status: Never Smoker   Smokeless tobacco: Never Used   Alcohol use No    Family History  Problem Relation Age of Onset   Diabetes Mother    Kidney disease Mother    Heart Problems Father    Heart disease Father    Autism Brother     No Known Allergies  Medication list has been reviewed and updated.  Current Outpatient Prescriptions on File Prior to Visit  Medication Sig Dispense Refill   carvedilol (COREG) 25 MG tablet TAKE 1 TABLET BY MOUTH 2 TIMES DAILY WITH A MEAL. 60 tablet 6   digoxin (LANOXIN) 0.125 MG tablet Take 1 tablet (125 mcg total) by mouth daily. 30 tablet 6   ENTRESTO 97-103 MG TAKE 1 TABLET BY MOUTH 2 TIMES DAILY. 60 tablet 5   furosemide (LASIX) 40 MG tablet TAKE 1 TABLET BY MOUTH ONCE DAILY ON MONDAYS, WEDNESDAYS, AND FRIDAYS ONLY. 15 tablet 6   spironolactone (ALDACTONE) 25 MG tablet TAKE 1 TABLET BY MOUTH ONCE DAILY 30 tablet 11   oxyCODONE-acetaminophen (ROXICET) 5-325 MG tablet Take 1 tablet by mouth every 8 (eight) hours as needed for severe pain. (Patient not taking: Reported on 02/02/2016) 20 tablet 0   No current facility-administered medications on file prior to visit.     Review of Systems  Constitutional: Negative for chills,  diaphoresis, fever and malaise/fatigue.  Respiratory: Negative for cough, shortness of breath and wheezing.   Cardiovascular: Negative for chest pain and palpitations.  Neurological: Negative for weakness.     Physical Examination: BP 124/80 (BP Location: Right Arm, Patient Position: Sitting, Cuff Size: Large)    Pulse (!) 50    Temp 97.6 F (36.4 C) (Oral)    Resp 17    Ht 6' 3.5" (1.918 m)    Wt 261 lb (118.4 kg)    SpO2 97%    BMI 32.19 kg/m  Ideal Body Weight: @FLOWAMB ([id number])@([phone])@  Physical Exam  Constitutional: He is oriented to  person, place, and time. He appears well-developed and well-nourished. No distress.  HENT:  Head: Normocephalic and atraumatic.  Eyes: Conjunctivae and EOM are normal. Pupils are equal, round, and reactive to light.  Cardiovascular: Normal rate.   Pulmonary/Chest: Effort normal. No respiratory distress.  Neurological: He is alert and oriented to person, place, and time.  Skin: Skin is warm and dry. He is not diaphoretic.  Psychiatric: He has a normal mood and affect. His behavior is normal.     Assessment and Plan: Eric Daniel is a 40 y.o. 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   At this time, his last EF was 15-20%.  This can be an immediate disqualifier.  Unfortunately, we can not give the DOT certification at this time.  I advised he will need a new echocardiogram that shows an EF >40%, per the DOT guidelines.  His past echocardiograms appear to be less than 40%, and we have consented.  I have reviewed the information to see if there is a loophole to this, as he is able-bodied, and PE and asymptomatic.   Encounter for examination required by Department of Transportation (DOT)  "Current guidelines on drivers with congestion cardiac failure or idiopathic dilated cardiomyopathy state that those with an EF of <40% should be disqualified."   Trena PlattStephanie English, PA-C [facility] 02/02/2016 11:59 AM  I personally performed the services described in this documentation, which was scribed in my presence. The recorded information has been reviewed and is accurate.

## 2016-02-02 NOTE — Patient Instructions (Addendum)
Please follow up with your cardiologist about a ECG. If the EF is not over 40% then we can not certify you at this time. If there is anything that I can help you with, please let me know.    IF you received an x-ray today, you will receive an invoice from [facility]. Please contact [facility] at [phone] with questions or concerns regarding your invoice.   IF you received labwork today, you will receive an invoice from [employer]. Please contact [facility] at [phone] with questions or concerns regarding your invoice.   Our billing staff will not be able to assist you with questions regarding bills from these companies.  You will be contacted with the lab results as soon as they are available. The fastest way to get your results is to activate your My Chart account. Instructions are located on the last page of this paperwork. If you have not heard from [place] regarding the results in 2 weeks, please contact this office.

## 2016-02-20 ENCOUNTER — Other Ambulatory Visit: Payer: Self-pay | Admitting: Family Medicine

## 2016-02-20 DIAGNOSIS — I5022 Chronic systolic (congestive) heart failure: Secondary | ICD-10-CM

## 2016-02-20 MED FILL — FUROSEMIDE 40 MG TABLET: 40 | 30 days supply | Qty: 15 | Fill #5

## 2016-02-20 MED FILL — CARVEDILOL 25 MG TABLET: 25 | 30 days supply | Qty: 60 | Fill #0

## 2016-02-20 MED FILL — DIGITEK 125 MCG TABLET: 125 | 30 days supply | Qty: 30 | Fill #3

## 2016-02-20 MED FILL — ENTRESTO 97 MG-103 MG TAB: 97-103 | 30 days supply | Qty: 60 | Fill #1

## 2016-02-20 NOTE — Telephone Encounter (Signed)
Medication refilled per protocol. 

## 2016-03-23 ENCOUNTER — Other Ambulatory Visit (HOSPITAL_COMMUNITY): Payer: Self-pay | Admitting: Internal Medicine

## 2016-03-23 DIAGNOSIS — I5022 Chronic systolic (congestive) heart failure: Secondary | ICD-10-CM

## 2016-03-23 MED FILL — ENTRESTO 97 MG-103 MG TAB: 97-103 | 30 days supply | Qty: 60 | Fill #2

## 2016-03-23 MED FILL — DIGITEK 125 MCG TABLET: 125 | 30 days supply | Qty: 30 | Fill #4

## 2016-03-23 MED FILL — CARVEDILOL 25 MG TABLET: 25 | 30 days supply | Qty: 60 | Fill #1

## 2016-03-23 MED FILL — FUROSEMIDE 40 MG TABLET: 40 | 30 days supply | Qty: 15 | Fill #6

## 2016-03-30 ENCOUNTER — Other Ambulatory Visit (HOSPITAL_COMMUNITY): Payer: Self-pay | Admitting: Cardiology

## 2016-03-30 MED ORDER — ATORVASTATIN CALCIUM 20 MG PO TABS
20.0000 mg | ORAL_TABLET | Freq: Every day | ORAL | 3 refills | Status: DC
Start: 2016-03-30 — End: 2017-05-06

## 2016-03-30 MED FILL — ATORVASTATIN 20 MG TABLET: 20 | 90 days supply | Qty: 90 | Fill #0

## 2016-04-03 ENCOUNTER — Encounter: Payer: 59 | Admitting: Family Medicine

## 2016-04-20 ENCOUNTER — Other Ambulatory Visit: Payer: Self-pay | Admitting: Family Medicine

## 2016-04-20 MED FILL — FUROSEMIDE 40 MG TABLET: 40 | 30 days supply | Qty: 15 | Fill #0

## 2016-04-20 MED FILL — DIGITEK 125 MCG TABLET: 125 | 30 days supply | Qty: 30 | Fill #5

## 2016-04-20 MED FILL — ENTRESTO 97 MG-103 MG TAB: 97-103 | 30 days supply | Qty: 60 | Fill #3

## 2016-04-20 MED FILL — CARVEDILOL 25 MG TABLET: 25 | 30 days supply | Qty: 60 | Fill #2

## 2016-04-20 MED FILL — SPIRONOLACTONE 25 MG TABLET: 25 | 90 days supply | Qty: 90 | Fill #1

## 2016-04-24 DIAGNOSIS — H52223 Regular astigmatism, bilateral: Secondary | ICD-10-CM | POA: Diagnosis not present

## 2016-05-21 MED FILL — CARVEDILOL 25 MG TABLET: 25 | 30 days supply | Qty: 60 | Fill #3

## 2016-06-04 MED FILL — FUROSEMIDE 40 MG TABLET: 40 | 30 days supply | Qty: 15 | Fill #1

## 2016-06-04 MED FILL — DIGITEK 125 MCG TABLET: 125 | 30 days supply | Qty: 30 | Fill #6

## 2016-06-07 MED FILL — ENTRESTO 97 MG-103 MG TAB: 97-103 | 30 days supply | Qty: 60 | Fill #4

## 2016-06-10 ENCOUNTER — Other Ambulatory Visit: Payer: Self-pay | Admitting: Family Medicine

## 2016-06-10 DIAGNOSIS — I5022 Chronic systolic (congestive) heart failure: Secondary | ICD-10-CM

## 2016-06-11 ENCOUNTER — Telehealth: Payer: 59 | Admitting: Nurse Practitioner

## 2016-06-11 DIAGNOSIS — R05 Cough: Secondary | ICD-10-CM | POA: Diagnosis not present

## 2016-06-11 DIAGNOSIS — R059 Cough, unspecified: Secondary | ICD-10-CM

## 2016-06-11 MED ORDER — LEVOFLOXACIN 500 MG PO TABS: 500.0000 mg | ORAL_TABLET | Freq: Every day | ORAL | 0 refills | Status: DC

## 2016-06-11 MED FILL — levoFLOXacin 500 MG TABS: 500 | 7 days supply | Qty: 7 | Fill #0

## 2016-06-11 NOTE — Progress Notes (Signed)
We are sorry that you are not feeling well.  Here is how we plan to help!  Based on what you have shared with me it looks like you have upper respiratory tract inflammation that has resulted in a significant cough.  Inflammation and infection in the upper respiratory tract is commonly called bronchitis and has four common causes:  Allergies, Viral Infections, Acid Reflux and Bacterial Infections.  Allergies, viruses and acid reflux are treated by controlling symptoms or eliminating the cause. An example might be a cough caused by taking certain blood pressure medications. You stop the cough by changing the medication. Another example might be a cough caused by acid reflux. Controlling the reflux helps control the cough.  Based on your presentation I believe you most likely have A cough due to bacteria.  When patients have a fever and a productive cough with a change in color or increased sputum production, we are concerned about bacterial bronchitis.  If left untreated it can progress to pneumonia.  If your symptoms do not improve with your treatment plan it is important that you contact your provider.   I hve prescribed Levofloxacin 500 mg daily for 7 days    In addition you may use A non-prescription cough medication called Mucinex DM: take 2 tablets every 12 hours.    USE OF BRONCHODILATOR ("RESCUE") INHALERS: There is a risk from using your bronchodilator too frequently.  The risk is that over-reliance on a medication which only relaxes the muscles surrounding the breathing tubes can reduce the effectiveness of medications prescribed to reduce swelling and congestion of the tubes themselves.  Although you feel brief relief from the bronchodilator inhaler, your asthma may actually be worsening with the tubes becoming more swollen and filled with mucus.  This can delay other crucial treatments, such as oral steroid medications. If you need to use a bronchodilator inhaler daily, several times per day,  you should discuss this with your provider.  There are probably better treatments that could be used to keep your asthma under control.     HOME CARE . Only take medications as instructed by your medical team. . Complete the entire course of an antibiotic. . Drink plenty of fluids and get plenty of rest. . Avoid close contacts especially the very young and the elderly . Cover your mouth if you cough or cough into your sleeve. . Always remember to wash your hands . A steam or ultrasonic humidifier can help congestion.   GET HELP RIGHT AWAY IF: . You develop worsening fever. . You become short of breath . You cough up blood. . Your symptoms persist after you have completed your treatment plan MAKE SURE YOU   Understand these instructions.  Will watch your condition.  Will get help right away if you are not doing well or get worse.  Your e-visit answers were reviewed by a board certified advanced clinical practitioner to complete your personal care plan.  Depending on the condition, your plan could have included both over the counter or prescription medications. If there is a problem please reply  once you have received a response from your provider. Your safety is important to [place].  If you have drug allergies check your prescription carefully.    You can use MyChart to ask questions about today's visit, request a non-urgent call back, or ask for a work or school excuse for 24 hours related to this e-Visit. If it has been greater than 24 hours you will need to  follow up with your provider, or enter a new e-Visit to address those concerns. You will get an e-mail in the next two days asking about your experience.  I hope that your e-visit has been valuable and will speed your recovery. Thank you for using e-visits.   

## 2016-06-25 MED FILL — CARVEDILOL 25 MG TABLET: 25 | 30 days supply | Qty: 60 | Fill #0

## 2016-07-09 MED FILL — ENTRESTO 97 MG-103 MG TAB: 97-103 | 30 days supply | Qty: 60 | Fill #5

## 2016-07-11 ENCOUNTER — Other Ambulatory Visit (HOSPITAL_COMMUNITY): Payer: Self-pay | Admitting: Internal Medicine

## 2016-07-11 MED FILL — DIGITEK 125 MCG TABLET: 125 | 30 days supply | Qty: 30 | Fill #0

## 2016-07-17 ENCOUNTER — Encounter (HOSPITAL_COMMUNITY): Payer: Self-pay | Admitting: Internal Medicine

## 2016-07-17 ENCOUNTER — Ambulatory Visit (HOSPITAL_COMMUNITY)
Admission: RE | Admit: 2016-07-17 | Discharge: 2016-07-17 | Disposition: A | Discharge status: Home / Self Care | Payer: 59 | Source: Ambulatory Visit | Attending: Internal Medicine | Admitting: Internal Medicine

## 2016-07-17 VITALS — BP 130/70 | HR 67 | Wt 263.0 lb

## 2016-07-17 DIAGNOSIS — I428 Other cardiomyopathies: Secondary | ICD-10-CM | POA: Insufficient documentation

## 2016-07-17 DIAGNOSIS — I5022 Chronic systolic (congestive) heart failure: Secondary | ICD-10-CM | POA: Insufficient documentation

## 2016-07-17 DIAGNOSIS — E781 Pure hyperglyceridemia: Secondary | ICD-10-CM | POA: Diagnosis not present

## 2016-07-17 DIAGNOSIS — E785 Hyperlipidemia, unspecified: Secondary | ICD-10-CM | POA: Insufficient documentation

## 2016-07-17 DIAGNOSIS — Z8249 Family history of ischemic heart disease and other diseases of the circulatory system: Secondary | ICD-10-CM | POA: Insufficient documentation

## 2016-07-17 DIAGNOSIS — Z79899 Other long term (current) drug therapy: Secondary | ICD-10-CM | POA: Insufficient documentation

## 2016-07-17 NOTE — Addendum Note (Signed)
Encounter addended by: Suezanne Cheshire, RN on: 07/17/2016 12:01 PM<BR>    Actions taken: Sign clinical note

## 2016-07-17 NOTE — Patient Instructions (Signed)
Follow up in 4 Months, we will contact you for follow up.

## 2016-07-17 NOTE — Progress Notes (Signed)
Patient ID: Eric Daniel, male   DOB: 1975/09/26, 41 y.o.   MRN: [id number]    HPI:  Eric Daniel is 41 y/o admitted with systolic HF to NICM (ER 20%) and hypertriglyceridemia (initial TGs 1200 in 7/15). Found to have EF 15% with restrictive diastolic parameters and mild to moderate RV dysfunction. 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 [name]   He was admitted to [facility] 07/06/12 with increased dyspnea on exertion. Pertinent admission labs included Pro BNP 3526, Troponin 0.43>0.39>0.42, creatinine 1.55, Hemoglobin 14.5, TSH 1.46, and potassium 4.9. UDS negative. He is also O+. Discharge weight 216 lbs   Follow up: He returns for follow up.  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 He is jogging 1 mile everyday. Runs 10 mins. Taking lasix about 1-2 days a week. Taking all medications. Occasionally lightheaded if standing up too quickly.   Echo 04/2015  EF  15-20% RV ok   RHC 07/08/13  RA = 13  RV = 62/11/145  PA = 61/36 (47)  PCW = 28  Fick cardiac output/index = 5.0/2.2  Thermo CO/CI 5.8/2.5  SVR = 1292  PVR = 3.3 Woods  FA sat = 96%  PA sat = 64%, 67%   [facility] CPX 08/01/12  Peak VO2: 37.10  VE/VCO2 slope: 22.40  Peak RER: 1.17  CPX 10/23/12 Resting HR: 60 Peak HR: 166 (90% age predicted max HR) BP rest: 111/74 BP peak: 182/109 Peak VO2: 30.7 (85.7% predicted peak VO2) VE/VCO2 slope: 24.8 OUES: 3.50 Peak RER: 1.32  CPX 06/19/2013: Resting HR 64: Peak HR 168 (92% predicted max HR) BP rest 118/80 BP peak: 164/90 Peak VO2: 27.9 (82.4% predicted VO2) VE/VCO2 slope: 26 OUES: 3.24 Peak RER: 1.27 - Mild desaturation during exercise from 99% at rest to 92% peak exercise  CPX 07/26/14 FVC 4.67 (75%)    FEV1 3.76 (74%)     FEV1/FVC 80 (100%)     Resting HR: 73 Peak HR: 157  (86% age predicted max HR) BP rest: 104/64 BP peak: 168/78 Peak VO2: 28.6 (89.1% predicted peak VO2) VE/VCO2 slope: 28.8 OUES: 4.64 Peak RER:  1.11 Ventilatory Threshold: 18.0 (56% predicted 63% measured peak VO2) VE/MVV: 51.7% O2pulse: 23 (109% predicted O2pulse)  ECHO 09/17/12: EF 20-25%; Diffuse hypokinesis. There is severe hypokinesis of the entire inferior myocardium' moderately dilated.  ECHO 04/08/13: EF 15%; Diff HK, grade II DD, RV mod dilated ECHO 01/20/2014 EF 20%  RV normal Grade DD  Labs:  09/17/12 Cr 1.29; BUN 17 10/22/12 Cr 1.5; BUN 16 12/14 Cr 1.42, BUN 15, K+ 4.2 05/2013: Cr 1.31, K+ 4.4, pro-BNP 433.7, dig level 0.7  7/15: K 4.2, creatinine 1.36, TGs 1186 01/22/2014: K 4.2 Creatinine 1.34  02/02/14 K 4.0 Creatinine 1.57 04/30/14 K 3.9 Creatinine 1.34 TC 225 LDL 149 HDL 38 TG 189 7/16: K 4.0 Creatinine 1.42 05/13/2015: 1.31  FH: Father with nonischemic cardiomyopathy and LVAD  SH: No smoking or ETOH. 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   ROS: All systems negative except as listed in HPI, PMH and Problem List.  Past Medical History:  Diagnosis Date  . CHF (congestive heart failure) (HCC) 06/2012  . Hyperlipidemia   . NSVT (nonsustained ventricular tachycardia) (HCC) 07/10/2012    Current Outpatient Prescriptions  Medication Sig Dispense Refill  . atorvastatin (LIPITOR) 20 MG tablet Take 1 tablet (20 mg total) by mouth daily. 90 tablet 3  . carvedilol (COREG) 25 MG tablet TAKE 1  TABLET BY MOUTH 2 TIMES DAILY WITH A MEAL. 60 tablet 3  . DIGITEK 125 MCG tablet TAKE 1 TABLET BY MOUTH DAILY. 30 tablet 6  . ENTRESTO 97-103 MG TAKE 1 TABLET BY MOUTH 2 TIMES DAILY. 60 tablet 5  . furosemide (LASIX) 40 MG tablet TAKE 1 TABLET BY MOUTH ONCE DAILY ON MONDAYS, WEDNESDAYS, AND FRIDAYS ONLY. 15 tablet 6  . levofloxacin (LEVAQUIN) 500 MG tablet Take 1 tablet (500 mg total) by mouth daily. 7 tablet 0  . spironolactone (ALDACTONE) 25 MG tablet TAKE 1 TABLET BY MOUTH ONCE DAILY 30 tablet 11   No current facility-administered medications for this encounter.    Vitals:   07/17/16 1148  BP: 130/70  Pulse:  67  SpO2: 99%  Weight: 263 lb (119.3 kg)    PHYSICAL EXAM: General:  Well appearing. No resp difficulty HEENT: normal anicteric Neck: supple. JVP flat Carotids 2+ bilaterally; no bruits. No lymphadenopathy or thryomegaly appreciated. Cor: PMI normal. RRR No m/r/g Lungs: clear Abdomen: soft, nontender, nondistended. No hepatosplenomegaly. No bruits or masses. Good bowel sounds. Extremities: no cyanosis, clubbing, rash. No edema.  Neuro: alert & orientedx3, cranial nerves grossly intact. Moves all 4 extremities w/o difficulty. Affect pleasant.  ASSESSMENT AND PLAN  1. 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   -  Doing very well. NYHA I symptoms.    - Echo 1/17 EF stable  - Continue lasix as needed . Taking 1-2 days/week - On goal dose coreg 25 mg BID  - Continue digoxin 0.125 mg daily and spironolactone 25 mg daily. - Continue Entresto to 97/103  bid.  - No ICD due to class I. At some point will need to repeat CPX.  2. Hyperlipidemia/Hypetriglyceridemia - Followed by PCP. Continue atorva 20.   Eric Meres, MD  11:50 AM

## 2016-07-25 MED FILL — ATORVASTATIN 20 MG TABLET: 20 | 90 days supply | Qty: 90 | Fill #1

## 2016-07-25 MED FILL — CARVEDILOL 25 MG TABLET: 25 | 30 days supply | Qty: 60 | Fill #1

## 2016-07-27 MED FILL — PREDNISOLONE AC 1% EYE DROP: 1 | 30 days supply | Qty: 5 | Fill #0

## 2016-08-03 ENCOUNTER — Other Ambulatory Visit (HOSPITAL_COMMUNITY): Payer: Self-pay | Admitting: *Deleted

## 2016-08-03 MED ORDER — SACUBITRIL-VALSARTAN 97-103 MG PO TABS: 1.0000 | ORAL_TABLET | Freq: Two times a day (BID) | ORAL | 6 refills | Status: DC

## 2016-08-03 MED FILL — FUROSEMIDE 40 MG TABLET: 40 | 30 days supply | Qty: 15 | Fill #2

## 2016-08-03 MED FILL — CARVEDILOL 25 MG TABLET: 25 | 5 days supply | Qty: 10 | Fill #2

## 2016-08-03 MED FILL — ENTRESTO 97 MG-103 MG TAB: 97-103 | 30 days supply | Qty: 60 | Fill #0

## 2016-08-03 MED FILL — SPIRONOLACTONE 25 MG TABLET: 25 | 90 days supply | Qty: 90 | Fill #2

## 2016-08-14 MED FILL — DIGOXIN 0.125 MG TABLET: 125 | 30 days supply | Qty: 30 | Fill #1

## 2016-08-29 ENCOUNTER — Ambulatory Visit (INDEPENDENT_AMBULATORY_CARE_PROVIDER_SITE_OTHER): Payer: 59 | Admitting: Family Medicine

## 2016-08-29 ENCOUNTER — Encounter: Payer: Self-pay | Admitting: Family Medicine

## 2016-08-29 VITALS — BP 128/70 | HR 64 | Temp 98.4°F | Resp 16 | Ht 75.75 in | Wt 264.0 lb

## 2016-08-29 DIAGNOSIS — L409 Psoriasis, unspecified: Secondary | ICD-10-CM

## 2016-08-29 DIAGNOSIS — N529 Male erectile dysfunction, unspecified: Secondary | ICD-10-CM

## 2016-08-29 DIAGNOSIS — L089 Local infection of the skin and subcutaneous tissue, unspecified: Secondary | ICD-10-CM | POA: Diagnosis not present

## 2016-08-29 DIAGNOSIS — L723 Sebaceous cyst: Secondary | ICD-10-CM | POA: Diagnosis not present

## 2016-08-29 MED ORDER — CEPHALEXIN 500 MG PO CAPS: 500.0000 mg | ORAL_CAPSULE | Freq: Two times a day (BID) | ORAL | 0 refills | Status: DC

## 2016-08-29 MED ORDER — SILDENAFIL CITRATE 100 MG PO TABS: 50.0000 mg | ORAL_TABLET | Freq: Every day | ORAL | 2 refills | Status: DC | PRN

## 2016-08-29 MED ORDER — FLUOCINOLONE ACETONIDE SCALP 0.01 % EX OIL: TOPICAL_OIL | CUTANEOUS | 1 refills | Status: DC

## 2016-08-29 MED FILL — CEPHALEXIN 500 MG CAPSULE: 500 | 5 days supply | Qty: 10 | Fill #0

## 2016-08-29 MED FILL — SILDENAFIL 100 MG TABLET: 100 | 30 days supply | Qty: 6 | Fill #0

## 2016-08-29 MED FILL — FLUOCINOLONE 0.01% SCALP OI: 0.01 | 30 days supply | Qty: 118 | Fill #0

## 2016-08-29 NOTE — Patient Instructions (Signed)
Continue T gel Get antibiotics Use scalp oil for psoriasis F/U as needed

## 2016-08-29 NOTE — Progress Notes (Signed)
   Subjective:    Patient ID: Eric Daniel, male    DOB: 03-11-1976, 41 y.o.   MRN: [id number]  Patient presents for Knot to back of neck (x2 months- cyst like area to back of neck on R side) and Dry Patches to Scalp (x2 months- states that he has been using special shampoo with no relief)  Here with scaly rash to the back of his head states is more like dry patches. He's had them on and off has happened in the past. He has been using what sounds like some type of dermatitis or T-Gel lites shampoo which helps some. He is also concerned about not medicine on the back of his neck for the past 2 months initially was tender. It has not changed in size but will not go down. No fever, no recent illness. He also requests a refill on Viagra  Review Of Systems:  GEN- denies fatigue, fever, weight loss,weakness, recent illness HEENT- denies eye drainage, change in vision, nasal discharge, CVS- denies chest pain, palpitations RESP- denies SOB, cough, wheeze ABD- denies N/V, change in stools, abd pain GU- denies dysuria, hematuria, dribbling, incontinence MSK- denies joint pain, muscle aches, injury Neuro- denies headache, dizziness, syncope, seizure activity       Objective:    BP 128/70   Pulse 64   Temp 98.4 F (36.9 C) (Oral)   Resp 16   Ht 6' 3.75" (1.924 m)   Wt 264 lb (119.7 kg)   SpO2 98%   BMI 32.35 kg/m  GEN- NAD, alert and oriented x3 Skin- occipital region hair erythematous silver scaley like patches with flaking scattered, no other lesions in scalp  Lower right neckline small marble size cyst, NT  Neck- Supple, LAD   Procedure- Incision and Drainage cyst  Procedure explained to patient questions answered benefits and risks discussed verbal consent obtained. Antiseptic-Betadine Anesthesia-lidocaine 1% with Epi Small Incision performed pus then cystic material expressed,pieces of cyst wall removed  Culture taken Minimal blood loss Patient tolerated procedure well Steri  strips applied Bandage applied        Assessment & Plan:      Problem List Items Addressed This Visit    ED (erectile dysfunction)    viagra refilled, he is not on long acting nitrate       Other Visit Diagnoses    Psoriasis of scalp    -  Primary   Continue T gel, add Dermasmoothe oil   Infected sebaceous cyst of skin       Culture sent, small cyst if recurrence advised general surgery for removal, keflex cover infection in setting of his heart disease   Relevant Medications   cephALEXin (KEFLEX) 500 MG capsule   Other Relevant Orders   WOUND CULTURE      Note: This dictation was prepared with Dragon dictation along with smaller phrase technology. Any transcriptional errors that result from this process are unintentional.

## 2016-08-29 NOTE — Assessment & Plan Note (Signed)
viagra refilled, he is not on long acting nitrate

## 2016-08-31 MED FILL — CARVEDILOL 25 MG TABLET: 25 | 30 days supply | Qty: 60 | Fill #3

## 2016-09-01 LAB — WOUND CULTURE
GRAM STAIN: NONE SEEN
GRAM STAIN: NONE SEEN
ORGANISM ID, BACTERIA: NORMAL

## 2016-09-05 MED FILL — FUROSEMIDE 40 MG TABLET: 40 | 30 days supply | Qty: 15 | Fill #3

## 2016-09-17 MED FILL — DIGOXIN 0.125 MG TABLET: 125 | 30 days supply | Qty: 30 | Fill #2

## 2016-09-24 ENCOUNTER — Telehealth: Payer: 59 | Admitting: Nurse Practitioner

## 2016-09-24 DIAGNOSIS — J209 Acute bronchitis, unspecified: Secondary | ICD-10-CM

## 2016-09-24 MED ORDER — BENZONATATE 100 MG PO CAPS: 100.0000 mg | ORAL_CAPSULE | Freq: Three times a day (TID) | ORAL | 0 refills | Status: AC | PRN

## 2016-09-24 MED ORDER — PREDNISONE 10 MG (21) PO TBPK: ORAL_TABLET | ORAL | 0 refills | Status: AC

## 2016-09-24 MED FILL — predniSONE 10 MG TABS: 10 | 6 days supply | Qty: 21 | Fill #0

## 2016-09-24 MED FILL — BENZONATATE 100 MG CAP: 100 | 10 days supply | Qty: 30 | Fill #0

## 2016-09-24 NOTE — Progress Notes (Signed)
We are sorry that you are not feeling well.  Here is how we plan to help!  Based on what you have shared with me it looks like you have upper respiratory tract inflammation that has resulted in a significant cough.  Inflammation and infection in the upper respiratory tract is commonly called bronchitis and has four common causes:  Allergies, Viral Infections, Acid Reflux and Bacterial Infections.  Allergies, viruses and acid reflux are treated by controlling symptoms or eliminating the cause. An example might be a cough caused by taking certain blood pressure medications. You stop the cough by changing the medication. Another example might be a cough caused by acid reflux. Controlling the reflux helps control the cough.  Based on your presentation I believe you most likely have A cough due to a virus.  This is called viral bronchitis and is best treated by rest, plenty of fluids and control of the cough.  You may use Ibuprofen or Tylenol as directed to help your symptoms.     In addition you may use A prescription cough medication called Tessalon Perles 100mg. You may take 1-2 capsules every 8 hours as needed for your cough.  Sterapred 10 mg dosepak  USE OF BRONCHODILATOR ("RESCUE") INHALERS: There is a risk from using your bronchodilator too frequently.  The risk is that over-reliance on a medication which only relaxes the muscles surrounding the breathing tubes can reduce the effectiveness of medications prescribed to reduce swelling and congestion of the tubes themselves.  Although you feel brief relief from the bronchodilator inhaler, your asthma may actually be worsening with the tubes becoming more swollen and filled with mucus.  This can delay other crucial treatments, such as oral steroid medications. If you need to use a bronchodilator inhaler daily, several times per day, you should discuss this with your provider.  There are probably better treatments that could be used to keep your asthma  under control.     HOME CARE . Only take medications as instructed by your medical team. . Complete the entire course of an antibiotic. . Drink plenty of fluids and get plenty of rest. . Avoid close contacts especially the very young and the elderly . Cover your mouth if you cough or cough into your sleeve. . Always remember to wash your hands . A steam or ultrasonic humidifier can help congestion.   GET HELP RIGHT AWAY IF: . You develop worsening fever. . You become short of breath . You cough up blood. . Your symptoms persist after you have completed your treatment plan MAKE SURE YOU   Understand these instructions.  Will watch your condition.  Will get help right away if you are not doing well or get worse.  Your e-visit answers were reviewed by a board certified advanced clinical practitioner to complete your personal care plan.  Depending on the condition, your plan could have included both over the counter or prescription medications. If there is a problem please reply  once you have received a response from your provider. Your safety is important to us.  If you have drug allergies check your prescription carefully.    You can use MyChart to ask questions about today's visit, request a non-urgent call back, or ask for a work or school excuse for 24 hours related to this e-Visit. If it has been greater than 24 hours you will need to follow up with your provider, or enter a new e-Visit to address those concerns. You will get an e-mail   in the next two days asking about your experience.  I hope that your e-visit has been valuable and will speed your recovery. Thank you for using e-visits.   

## 2016-10-08 MED FILL — CARVEDILOL 25 MG TABLET: 25 | 25 days supply | Qty: 50 | Fill #4

## 2016-10-08 MED FILL — ATORVASTATIN 20 MG TABLET: 20 | 90 days supply | Qty: 90 | Fill #2

## 2016-10-08 MED FILL — ENTRESTO 97 MG-103 MG TAB: 97-103 | 30 days supply | Qty: 60 | Fill #1

## 2016-10-11 MED FILL — DIGOXIN 0.125 MG TABLET: 125 | 30 days supply | Qty: 30 | Fill #3

## 2016-10-30 ENCOUNTER — Other Ambulatory Visit: Payer: Self-pay | Admitting: Family Medicine

## 2016-10-30 DIAGNOSIS — I5022 Chronic systolic (congestive) heart failure: Secondary | ICD-10-CM

## 2016-10-30 MED FILL — CARVEDILOL 25 MG TABLET: 25 | 30 days supply | Qty: 60 | Fill #0

## 2016-10-30 MED FILL — FLUOCINOLONE 0.01% SCALP OI: 0.01 | 30 days supply | Qty: 118 | Fill #1

## 2016-10-30 NOTE — Telephone Encounter (Signed)
Refill appropriate and filled per protocol. 

## 2016-11-13 MED FILL — DIGOXIN 0.125 MG TABLET: 125 | 30 days supply | Qty: 30 | Fill #4

## 2016-11-13 MED FILL — ENTRESTO 97 MG-103 MG TAB: 97-103 | 30 days supply | Qty: 60 | Fill #2

## 2016-11-19 MED FILL — SPIRONOLACTONE 25 MG TABLET: 25 | 90 days supply | Qty: 90 | Fill #3

## 2016-11-19 MED FILL — FUROSEMIDE 40 MG TABLET: 40 | 30 days supply | Qty: 15 | Fill #4

## 2016-12-03 MED FILL — CARVEDILOL 25 MG TABLET: 25 | 30 days supply | Qty: 60 | Fill #1

## 2016-12-13 MED FILL — ENTRESTO 97 MG-103 MG TAB: 97-103 | 30 days supply | Qty: 60 | Fill #3

## 2016-12-13 MED FILL — DIGOXIN 0.125 MG TABLET: 125 | 30 days supply | Qty: 30 | Fill #5

## 2017-01-09 MED FILL — CARVEDILOL 25 MG TABLET: 25 | 30 days supply | Qty: 60 | Fill #2

## 2017-01-10 MED FILL — ATORVASTATIN 20 MG TABLET: 20 | 90 days supply | Qty: 90 | Fill #3

## 2017-01-10 MED FILL — DIGOXIN 0.125 MG TABLET: 125 | 30 days supply | Qty: 30 | Fill #6

## 2017-01-10 MED FILL — ENTRESTO 97 MG-103 MG TAB: 97-103 | 30 days supply | Qty: 60 | Fill #4

## 2017-01-10 MED FILL — FUROSEMIDE 40 MG TABLET: 40 | 30 days supply | Qty: 15 | Fill #5

## 2017-02-11 ENCOUNTER — Other Ambulatory Visit (HOSPITAL_COMMUNITY): Payer: Self-pay | Admitting: Internal Medicine

## 2017-02-11 MED FILL — ENTRESTO 97 MG-103 MG TAB: 97-103 | 30 days supply | Qty: 60 | Fill #5

## 2017-02-11 MED FILL — CARVEDILOL 25 MG TABLET: 25 | 30 days supply | Qty: 60 | Fill #3

## 2017-02-12 MED FILL — DIGOXIN 0.125 MG TABLET: 125 | 30 days supply | Qty: 30 | Fill #0

## 2017-02-25 ENCOUNTER — Other Ambulatory Visit: Payer: Self-pay | Admitting: Family Medicine

## 2017-02-25 DIAGNOSIS — I5022 Chronic systolic (congestive) heart failure: Secondary | ICD-10-CM

## 2017-02-26 MED FILL — SPIRONOLACTONE 25 MG TABLET: 25 | 90 days supply | Qty: 90 | Fill #0

## 2017-02-26 MED FILL — FUROSEMIDE 40 MG TAB: 40 | 30 days supply | Qty: 15 | Fill #6

## 2017-03-01 ENCOUNTER — Encounter: Payer: Self-pay | Admitting: Family Medicine

## 2017-03-01 ENCOUNTER — Other Ambulatory Visit: Payer: Self-pay | Admitting: Family Medicine

## 2017-03-01 MED FILL — FLUOCINOLONE 0.01% SCALP OI: 0.01 | 30 days supply | Qty: 118 | Fill #0

## 2017-03-14 ENCOUNTER — Other Ambulatory Visit: Payer: Self-pay | Admitting: Family Medicine

## 2017-03-14 DIAGNOSIS — I5022 Chronic systolic (congestive) heart failure: Secondary | ICD-10-CM

## 2017-03-14 MED FILL — CARVEDILOL 25 MG TABLET: 25 | 30 days supply | Qty: 60 | Fill #0

## 2017-03-14 MED FILL — DIGOXIN 0.125 MG TABLET: 125 | 30 days supply | Qty: 30 | Fill #1

## 2017-03-14 MED FILL — ENTRESTO 97 MG-103 MG TAB: 97-103 | 30 days supply | Qty: 60 | Fill #6

## 2017-03-19 ENCOUNTER — Ambulatory Visit (INDEPENDENT_AMBULATORY_CARE_PROVIDER_SITE_OTHER): Payer: 59 | Admitting: Family Medicine

## 2017-03-19 ENCOUNTER — Other Ambulatory Visit: Payer: Self-pay

## 2017-03-19 ENCOUNTER — Encounter: Payer: Self-pay | Admitting: Family Medicine

## 2017-03-19 VITALS — BP 132/84 | HR 70 | Temp 97.7°F | Resp 18 | Ht 75.75 in | Wt 259.0 lb

## 2017-03-19 DIAGNOSIS — I428 Other cardiomyopathies: Secondary | ICD-10-CM | POA: Diagnosis not present

## 2017-03-19 DIAGNOSIS — R232 Flushing: Secondary | ICD-10-CM

## 2017-03-19 DIAGNOSIS — E781 Pure hyperglyceridemia: Secondary | ICD-10-CM

## 2017-03-19 DIAGNOSIS — R001 Bradycardia, unspecified: Secondary | ICD-10-CM | POA: Diagnosis not present

## 2017-03-19 LAB — COMPREHENSIVE METABOLIC PANEL
AG Ratio: 1.8 (calc) (ref 1.0–2.5)
ALBUMIN MSPROF: 4.4 g/dL (ref 3.6–5.1)
ALT: 39 U/L (ref 9–46)
AST: 31 U/L (ref 10–40)
Alkaline phosphatase (APISO): 50 U/L (ref 40–115)
BUN/Creatinine Ratio: 10 (calc) (ref 6–22)
BUN: 15 mg/dL (ref 7–25)
CO2: 29 mmol/L (ref 20–32)
CREATININE: 1.45 mg/dL — AB (ref 0.60–1.35)
Calcium: 9.6 mg/dL (ref 8.6–10.3)
Chloride: 103 mmol/L (ref 98–110)
Globulin: 2.5 g/dL (calc) (ref 1.9–3.7)
Glucose, Bld: 105 mg/dL — ABNORMAL HIGH (ref 65–99)
POTASSIUM: 4.3 mmol/L (ref 3.5–5.3)
SODIUM: 140 mmol/L (ref 135–146)
TOTAL PROTEIN: 6.9 g/dL (ref 6.1–8.1)
Total Bilirubin: 1 mg/dL (ref 0.2–1.2)

## 2017-03-19 LAB — CBC WITH DIFFERENTIAL/PLATELET
BASOS PCT: 0.6 %
Basophils Absolute: 29 cells/uL (ref 0–200)
EOS PCT: 2.1 %
Eosinophils Absolute: 101 cells/uL (ref 15–500)
HEMATOCRIT: 40.3 % (ref 38.5–50.0)
Hemoglobin: 13.8 g/dL (ref 13.2–17.1)
LYMPHS ABS: 1488 {cells}/uL (ref 850–3900)
MCH: 29.2 pg (ref 27.0–33.0)
MCHC: 34.2 g/dL (ref 32.0–36.0)
MCV: 85.4 fL (ref 80.0–100.0)
MPV: 12.4 fL (ref 7.5–12.5)
Monocytes Relative: 13.5 %
NEUTROS PCT: 52.8 %
Neutro Abs: 2534 cells/uL (ref 1500–7800)
PLATELETS: 169 10*3/uL (ref 140–400)
RBC: 4.72 10*6/uL (ref 4.20–5.80)
RDW: 13 % (ref 11.0–15.0)
TOTAL LYMPHOCYTE: 31 %
WBC mixed population: 648 cells/uL (ref 200–950)
WBC: 4.8 10*3/uL (ref 3.8–10.8)

## 2017-03-19 LAB — TSH: TSH: 2.98 m[IU]/L (ref 0.40–4.50)

## 2017-03-19 LAB — LIPID PANEL
CHOL/HDL RATIO: 4.5 (calc) (ref <5.0)
Cholesterol: 175 mg/dL (ref <200)
HDL: 39 mg/dL — ABNORMAL LOW (ref >40)
LDL CHOLESTEROL (CALC): 92 mg/dL
NON-HDL CHOLESTEROL (CALC): 136 mg/dL — AB (ref <130)
Triglycerides: 328 mg/dL — ABNORMAL HIGH (ref <150)

## 2017-03-19 NOTE — Assessment & Plan Note (Signed)
He has nonischemic cardiomyopathy was not complaining of any chest pain or palpitations.  He does not have any orthostasis based on his blood pressure readings here in the office but he does have some intermittent bradycardia.  Query if the beta-blocker is causing him symptoms when he is trying to exercise and get his heart rate up.  We will going to give a trial decrease in his carvedilol to 12.5 mg twice a day he will continue the rest of his medications with the exception of Lasix only as needed as he is not fluid overloaded and is also on the Entresto.  We will also check his labs including his TSH to see if there is any abnormalities they are contributing.  Overall he looks healthy is able to do his regular routine including more aerobic exercise without any difficulties.  I doubt GI etiology based on history.  He is going to follow-up with me in about 4-6 weeks for his physical we will see how his symptoms are progressing.  Of course if he does develop any chest pain palpitations edema we will give him a urgent visit with his cardiologist.

## 2017-03-19 NOTE — Progress Notes (Signed)
Patient ID: Eric Daniel, male   DOB: [birth year], 41 y.o.   MRN: [id number]   Subjective:    Patient ID: Eric Daniel, male    DOB: [birth year], 41 y.o.   MRN: [id number]  Patient presents for Nausea (x2 weeks- intermittent nausea when he stands up) Patient here with she here with intermittent episodes of nausea flushing feeling lightheaded when he is exercising past 3 weeks or so.  States that it typically occurs when he is doing strength training for him when he is laying back and gets up quickly from the weight bench.  States that he can do his typical cardio and feels okay.  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  He does have underlying history of nonischemic cardiomyopathy which is thought to be genetic he also has congestive heart failure.  He was last seen by cardiology in April of this year.  He is taking most of his medications as prescribed with the exception of the Lipitor he also uses the Lasix only as needed.  He has not had any recent labs.  He denies any chest pain palpitations or abdominal pain.  He does not had any difficulties when he eats.  He denies any shortness of breath.  Denies any recent illness.      Review Of Systems:  GEN- denies fatigue, fever, weight loss,weakness, recent illness HEENT- denies eye drainage, change in vision, nasal discharge, CVS- denies chest pain, palpitations RESP- denies SOB, cough, wheeze ABD-+ N/V,  Denies change in stools, abd pain GU- denies dysuria, hematuria, dribbling, incontinence MSK- denies joint pain, muscle aches, injury Neuro- denies headache, dizziness, syncope, seizure activity       Objective:    BP 132/84   Pulse 70   Temp 97.7 F (36.5 C) (Oral)   Resp 18   Ht 6' 3.75" (1.924 m)   Wt 259 lb (117.5 kg)   SpO2 98%   BMI 31.73 kg/m  GEN- NAD, alert and oriented x3 HEENT- PERRL, EOMI, non injected sclera, pink conjunctiva, MMM, oropharynx clear Neck- Supple, no  thyromegaly CVS- RRR, no murmur RESP- BRadycardia HR 68  ABD-NABS,soft,NT,ND EXT- No edema Neuro-CNII-XII in tact no deficits  Pulses- Radial 2+  EKG- Bradycardia, old infarct, low voltage , compared to 2018 EKG  Lying 128/78 , Sitting 132/84  Standing 130/76     Assessment & Plan:      Problem List Items Addressed This Visit      Unprioritized   High triglycerides   Relevant Orders   Lipid panel   Nonischemic cardiomyopathy ? familial-restrictive - Primary    He has nonischemic cardiomyopathy was not complaining of any chest pain or palpitations.  He does not have any orthostasis based on his blood pressure readings here in the office but he does have some intermittent bradycardia.  Query if the beta-blocker is causing him symptoms when he is trying to exercise and get his heart rate up.  We will going to give a trial decrease in his carvedilol to 12.5 mg twice a day he will continue the rest of his medications with the exception of Lasix only as needed as he is not fluid overloaded and is also on the Entresto.  We will also check his labs including his TSH to see if there is any abnormalities they are contributing.  Overall he looks healthy is able to do his regular routine including more aerobic exercise without any difficulties.  I doubt  GI etiology based on history.  He is going to follow-up with me in about 4-6 weeks for his physical we will see how his symptoms are progressing.  Of course if he does develop any chest pain palpitations edema we will give him a urgent visit with his cardiologist.      Relevant Orders   CBC with Differential/Platelet   Comprehensive metabolic panel   TSH   EKG 12-Lead (Completed)   Bradycardia    Other Visit Diagnoses    Vasomotor flushing       Relevant Orders   TSH      Note: This dictation was prepared with Dragon dictation along with smaller phrase technology. Any transcriptional errors that result from this process are unintentional.

## 2017-03-19 NOTE — Patient Instructions (Addendum)
Continue current medications Decrease Coreg to  12.5mg  twice a day  We will call with lab results  F/U f/u for your physical

## 2017-03-29 MED FILL — FUROSEMIDE 40 MG TAB: 40 | 30 days supply | Qty: 15 | Fill #0

## 2017-04-08 MED FILL — DIGOXIN 0.125 MG TABLET: 125 | 30 days supply | Qty: 30 | Fill #2

## 2017-04-24 ENCOUNTER — Other Ambulatory Visit (HOSPITAL_COMMUNITY): Payer: Self-pay | Admitting: Internal Medicine

## 2017-04-25 MED FILL — ENTRESTO 97 MG-103 MG TAB: 97-103 | 30 days supply | Qty: 60 | Fill #0

## 2017-05-06 ENCOUNTER — Other Ambulatory Visit (HOSPITAL_COMMUNITY): Payer: Self-pay | Admitting: Internal Medicine

## 2017-05-06 MED FILL — DIGOXIN 0.125 MG TABLET: 125 | 30 days supply | Qty: 30 | Fill #3

## 2017-05-06 MED FILL — CARVEDILOL 25 MG TABLET: 25 | 30 days supply | Qty: 60 | Fill #1

## 2017-05-06 MED FILL — FUROSEMIDE 40 MG TAB: 40 | 30 days supply | Qty: 15 | Fill #1

## 2017-05-08 ENCOUNTER — Encounter: Payer: 59 | Admitting: Family Medicine

## 2017-05-08 MED FILL — ATORVASTATIN 20 MG TABLET: 20 | 90 days supply | Qty: 90 | Fill #0

## 2017-05-09 MED FILL — SPIRONOLACTONE 25 MG TABLET: 25 | 90 days supply | Qty: 90 | Fill #1

## 2017-05-20 MED FILL — ENTRESTO 97 MG-103 MG TAB: 97-103 | 30 days supply | Qty: 60 | Fill #1

## 2017-06-05 MED FILL — FUROSEMIDE 40 MG TAB: 40 | 30 days supply | Qty: 15 | Fill #2

## 2017-06-05 MED FILL — DIGOXIN 0.125 MG TABLET: 125 | 30 days supply | Qty: 30 | Fill #4

## 2017-06-05 MED FILL — CARVEDILOL 25 MG TABLET: 25 | 30 days supply | Qty: 60 | Fill #2

## 2017-06-13 MED FILL — ENTRESTO 97 MG-103 MG TAB: 97-103 | 30 days supply | Qty: 60 | Fill #2

## 2017-07-09 MED FILL — CARVEDILOL 25 MG TABLET: 25 | 30 days supply | Qty: 60 | Fill #3

## 2017-07-09 MED FILL — FUROSEMIDE 40 MG TAB: 40 | 30 days supply | Qty: 15 | Fill #3

## 2017-07-09 MED FILL — DIGOXIN 0.125 MG TABLET: 125 | 30 days supply | Qty: 30 | Fill #5

## 2017-07-23 ENCOUNTER — Other Ambulatory Visit (HOSPITAL_COMMUNITY): Payer: Self-pay | Admitting: Internal Medicine

## 2017-07-24 MED FILL — FLUOCINOLONE ACETONIDE SCAL: 0.01 | 30 days supply | Qty: 118 | Fill #1

## 2017-07-25 MED FILL — ENTRESTO 97 MG-103 MG TAB: 97-103 | 30 days supply | Qty: 60 | Fill #0

## 2017-07-25 MED FILL — ATORVASTATIN 20 MG TABLET: 20 | 30 days supply | Qty: 30 | Fill #0

## 2017-07-29 ENCOUNTER — Other Ambulatory Visit: Payer: Self-pay

## 2017-07-29 ENCOUNTER — Ambulatory Visit (INDEPENDENT_AMBULATORY_CARE_PROVIDER_SITE_OTHER): Payer: 59 | Admitting: Family Medicine

## 2017-07-29 ENCOUNTER — Encounter: Payer: Self-pay | Admitting: Family Medicine

## 2017-07-29 VITALS — BP 118/64 | HR 58 | Temp 97.9°F | Resp 14 | Ht 75.75 in | Wt 261.0 lb

## 2017-07-29 DIAGNOSIS — E782 Mixed hyperlipidemia: Secondary | ICD-10-CM

## 2017-07-29 DIAGNOSIS — Z0000 Encounter for general adult medical examination without abnormal findings: Secondary | ICD-10-CM | POA: Diagnosis not present

## 2017-07-29 DIAGNOSIS — I5022 Chronic systolic (congestive) heart failure: Secondary | ICD-10-CM

## 2017-07-29 DIAGNOSIS — I428 Other cardiomyopathies: Secondary | ICD-10-CM | POA: Diagnosis not present

## 2017-07-29 LAB — LIPID PANEL
Cholesterol: 171 mg/dL (ref <200)
HDL: 40 mg/dL — ABNORMAL LOW (ref >40)
LDL Cholesterol (Calc): 89 mg/dL (calc)
Non-HDL Cholesterol (Calc): 131 mg/dL (calc) — ABNORMAL HIGH (ref <130)
Total CHOL/HDL Ratio: 4.3 (calc) (ref <5.0)
Triglycerides: 291 mg/dL — ABNORMAL HIGH (ref <150)

## 2017-07-29 LAB — COMPREHENSIVE METABOLIC PANEL
AG RATIO: 1.9 (calc) (ref 1.0–2.5)
ALBUMIN MSPROF: 4.4 g/dL (ref 3.6–5.1)
ALKALINE PHOSPHATASE (APISO): 47 U/L (ref 40–115)
ALT: 20 U/L (ref 9–46)
AST: 18 U/L (ref 10–40)
BILIRUBIN TOTAL: 1.2 mg/dL (ref 0.2–1.2)
BUN/Creatinine Ratio: 10 (calc) (ref 6–22)
BUN: 15 mg/dL (ref 7–25)
CALCIUM: 9.7 mg/dL (ref 8.6–10.3)
CHLORIDE: 103 mmol/L (ref 98–110)
CO2: 31 mmol/L (ref 20–32)
Creat: 1.51 mg/dL — ABNORMAL HIGH (ref 0.60–1.35)
Globulin: 2.3 g/dL (calc) (ref 1.9–3.7)
Glucose, Bld: 107 mg/dL — ABNORMAL HIGH (ref 65–99)
POTASSIUM: 4.5 mmol/L (ref 3.5–5.3)
SODIUM: 140 mmol/L (ref 135–146)
TOTAL PROTEIN: 6.7 g/dL (ref 6.1–8.1)

## 2017-07-29 LAB — CBC WITH DIFFERENTIAL/PLATELET
Basophils Absolute: 31 cells/uL (ref 0–200)
Basophils Relative: 0.7 %
EOS PCT: 4.3 %
Eosinophils Absolute: 189 cells/uL (ref 15–500)
HEMATOCRIT: 38.1 % — AB (ref 38.5–50.0)
HEMOGLOBIN: 13.3 g/dL (ref 13.2–17.1)
LYMPHS ABS: 1395 {cells}/uL (ref 850–3900)
MCH: 29.6 pg (ref 27.0–33.0)
MCHC: 34.9 g/dL (ref 32.0–36.0)
MCV: 84.7 fL (ref 80.0–100.0)
MPV: 12.2 fL (ref 7.5–12.5)
Monocytes Relative: 11.8 %
NEUTROS ABS: 2266 {cells}/uL (ref 1500–7800)
NEUTROS PCT: 51.5 %
Platelets: 164 10*3/uL (ref 140–400)
RBC: 4.5 10*6/uL (ref 4.20–5.80)
RDW: 13 % (ref 11.0–15.0)
Total Lymphocyte: 31.7 %
WBC: 4.4 10*3/uL (ref 3.8–10.8)
WBCMIX: 519 {cells}/uL (ref 200–950)

## 2017-07-29 MED ORDER — CARVEDILOL 25 MG PO TABS
ORAL_TABLET | ORAL | 3 refills | Status: DC
Start: 2017-07-29 — End: 2017-08-27

## 2017-07-29 MED ORDER — FLUOCINOLONE ACETONIDE SCALP 0.01 % EX OIL: TOPICAL_OIL | CUTANEOUS | 2 refills | Status: AC

## 2017-07-29 NOTE — Progress Notes (Signed)
   Subjective:    Patient ID: Eric Daniel, male    DOB: 24-Sep-1975, 42 y.o.   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                        Review Of Systems:  GEN- denies fatigue, fever, weight loss,weakness, recent illness HEENT- denies eye drainage, change in vision, nasal discharge, CVS- denies chest pain, palpitations RESP- denies SOB, cough, wheeze ABD- denies N/V, change in stools, abd pain GU- denies dysuria, hematuria, dribbling, incontinence MSK- denies joint pain, muscle aches, injury Neuro- denies headache, +dizziness, syncope, seizure activity       Objective:    BP 118/64   Pulse (!) 58   Temp 97.9 F (36.6 C) (Oral)   Resp 14   Ht 6' 3.75" (1.924 m)   Wt 261 lb (118.4 kg)   SpO2 99%   BMI 31.98 kg/m  GEN- NAD, alert and oriented x3 HEENT- PERRL, EOMI, non injected sclera, pink conjunctiva, MMM, oropharynx clear Neck- Supple, no thyromegaly CVS- bradycardia, no murmur RESP-CTAB ABD-NABS,soft,NT,ND EXT- No edema Pulses- Radial, DP- 2+        Assessment & Plan:      Problem List Items Addressed This Visit      Unprioritized   Chronic systolic heart failure (HCC)   Relevant Medications   carvedilol (COREG) 25 MG tablet   Nonischemic cardiomyopathy ? familial-restrictive   Relevant Medications   carvedilol (COREG) 25 MG tablet   Other Relevant Orders   Lipid panel (Completed)   Routine general medical examination at a health care facility - Primary    CPE done, he is compensated for CHF, but  needs f/u with cardiology  With bradycardia and symptoms decrease to 12.5mg  of Coreg  No change to other medications Fasting labs Prevention UTD       Relevant Orders   CBC with Differential/Platelet (Completed)   Comprehensive metabolic panel (Completed)    Other Visit Diagnoses    Mixed hyperlipidemia       Relevant Medications   carvedilol (COREG) 25 MG tablet   Other Relevant Orders   Lipid panel (Completed)      Note: This dictation was prepared with Dragon dictation along with smaller phrase technology. Any transcriptional errors that result from this process are unintentional.

## 2017-07-29 NOTE — Patient Instructions (Addendum)
Referral cardiology We will call with your lab results  F/U 6 months

## 2017-07-30 ENCOUNTER — Encounter: Payer: Self-pay | Admitting: Family Medicine

## 2017-07-30 NOTE — Assessment & Plan Note (Signed)
CPE done, he is compensated for CHF, but needs f/u with cardiology  With bradycardia and symptoms decrease to 12.5mg  of Coreg  No change to other medications Fasting labs Prevention UTD

## 2017-08-06 ENCOUNTER — Other Ambulatory Visit: Payer: Self-pay | Admitting: *Deleted

## 2017-08-06 MED ORDER — FISH OIL 1000 MG PO CPDR: 1.0000 | DELAYED_RELEASE_CAPSULE | Freq: Two times a day (BID) | ORAL | 3 refills | Status: AC

## 2017-08-06 MED ORDER — ATORVASTATIN CALCIUM 40 MG PO TABS: 40.0000 mg | ORAL_TABLET | Freq: Every day | ORAL | 3 refills | Status: AC

## 2017-08-07 ENCOUNTER — Telehealth (HOSPITAL_COMMUNITY): Payer: Self-pay | Admitting: Internal Medicine

## 2017-08-07 NOTE — Telephone Encounter (Signed)
Left message for patient to call back.  Need to schedule f/u with Dr. Gala Romney, next available.

## 2017-08-27 ENCOUNTER — Other Ambulatory Visit: Payer: Self-pay

## 2017-08-27 ENCOUNTER — Other Ambulatory Visit: Payer: Self-pay | Admitting: Family Medicine

## 2017-08-27 ENCOUNTER — Other Ambulatory Visit (HOSPITAL_COMMUNITY): Payer: Self-pay | Admitting: Internal Medicine

## 2017-08-27 DIAGNOSIS — I5022 Chronic systolic (congestive) heart failure: Secondary | ICD-10-CM

## 2017-08-27 MED ORDER — CARVEDILOL 25 MG PO TABS: 12.5000 mg | ORAL_TABLET | Freq: Two times a day (BID) | ORAL | 3 refills | Status: DC

## 2017-08-27 MED FILL — DIGOXIN 0.125 MG TABLET: 125 | 30 days supply | Qty: 30 | Fill #6

## 2017-08-27 MED FILL — SPIRONOLACTONE 25 MG TABLET: 25 | 90 days supply | Qty: 90 | Fill #2

## 2017-08-27 MED FILL — FUROSEMIDE 40 MG TAB: 40 | 30 days supply | Qty: 15 | Fill #4

## 2017-08-27 MED FILL — CARVEDILOL 25 MG TABLET: 25 | 30 days supply | Qty: 30 | Fill #0

## 2017-08-28 MED FILL — ENTRESTO 97 MG-103 MG TAB: 97-103 | 30 days supply | Qty: 60 | Fill #0

## 2017-08-28 MED FILL — ATORVASTATIN 40 MG TABLET: 40 | 90 days supply | Qty: 90 | Fill #0

## 2017-09-17 ENCOUNTER — Other Ambulatory Visit (HOSPITAL_COMMUNITY): Payer: Self-pay | Admitting: Internal Medicine

## 2017-09-18 ENCOUNTER — Other Ambulatory Visit: Payer: Self-pay

## 2017-09-18 ENCOUNTER — Ambulatory Visit (HOSPITAL_COMMUNITY)
Admission: RE | Admit: 2017-09-18 | Discharge: 2017-09-18 | Disposition: A | Discharge status: Home / Self Care | Payer: 59 | Source: Ambulatory Visit | Attending: Internal Medicine | Admitting: Internal Medicine

## 2017-09-18 ENCOUNTER — Encounter (HOSPITAL_COMMUNITY): Payer: Self-pay | Admitting: Internal Medicine

## 2017-09-18 VITALS — BP 118/69 | HR 64 | Wt 260.8 lb

## 2017-09-18 DIAGNOSIS — I428 Other cardiomyopathies: Secondary | ICD-10-CM

## 2017-09-18 DIAGNOSIS — R9431 Abnormal electrocardiogram [ECG] [EKG]: Secondary | ICD-10-CM | POA: Diagnosis not present

## 2017-09-18 DIAGNOSIS — I429 Cardiomyopathy, unspecified: Secondary | ICD-10-CM | POA: Diagnosis not present

## 2017-09-18 DIAGNOSIS — I5022 Chronic systolic (congestive) heart failure: Secondary | ICD-10-CM | POA: Diagnosis not present

## 2017-09-18 DIAGNOSIS — E781 Pure hyperglyceridemia: Secondary | ICD-10-CM | POA: Insufficient documentation

## 2017-09-18 DIAGNOSIS — Z79899 Other long term (current) drug therapy: Secondary | ICD-10-CM | POA: Diagnosis not present

## 2017-09-18 MED ORDER — SPIRONOLACTONE 25 MG PO TABS: 25.0000 mg | ORAL_TABLET | Freq: Every day | ORAL | 11 refills | Status: AC

## 2017-09-18 MED ORDER — CARVEDILOL 25 MG PO TABS: 12.5000 mg | ORAL_TABLET | Freq: Two times a day (BID) | ORAL | 11 refills | Status: AC

## 2017-09-18 NOTE — Addendum Note (Signed)
Encounter addended by: Dolores Patty, MD on: 09/18/2017 11:19 AM  Actions taken: LOS modified

## 2017-09-18 NOTE — Patient Instructions (Signed)
Your physician has requested that you have an echocardiogram. Echocardiography is a painless test that uses sound waves to create images of your heart. It provides your doctor with information about the size and shape of your heart and how well your heart's chambers and valves are working. This procedure takes approximately one hour. There are no restrictions for this procedure.  Your physician has recommended that you have a cardiopulmonary stress test (CPX). CPX testing is a non-invasive measurement of heart and lung function. It replaces a traditional treadmill stress test. This type of test provides a tremendous amount of information that relates not only to your present condition but also for future outcomes. This test combines measurements of you ventilation, respiratory gas exchange in the lungs, electrocardiogram (EKG), blood pressure and physical response before, during, and following an exercise protocol.  We will contact you in 6 months to schedule your next appointment.

## 2017-09-18 NOTE — Progress Notes (Signed)
Patient ID: Eric Daniel, male   DOB: [birth year], 42 y.o.   MRN: [id number]    HPI:  Eric Daniel is a 43 y.o. male admitted with systolic HF to NICM (ER 20%) and hypertriglyceridemia (initial TGs 1200 in 7/15). Found to have EF 15% with restrictive diastolic parameters and mild to moderate RV dysfunction. 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 [name]   He was admitted to [facility] 07/06/12 with increased dyspnea on exertion. Pertinent admission labs included Pro BNP 3526, Troponin 0.43>0.39>0.42, creatinine 1.55, Hemoglobin 14.5, TSH 1.46, and potassium 4.9. UDS negative. He is also O+. Discharge weight 216 lbs   He presents today for annual follow up. Feels good. Still coaching. Remains active. [place] [employer] [place] [employer] [place] almost an hour and a half every day. Does push-ups, burpees, etc. Jogging 1-2 miles at a time. Denies SOB, edema. No problem his medications. Takes lasix on M/W/F and when needed.   Echo 04/2015  EF  15-20% RV ok   RHC 07/08/13 RA = 13  RV = 62/11/145  PA = 61/36 (47)  PCW = 28  Fick cardiac output/index = 5.0/2.2  Thermo CO/CI 5.8/2.5  SVR = 1292  PVR = 3.3 [place]  FA sat = 96%  PA sat = 64%, 67%   [facility] CPX 08/01/12  Peak VO2: 37.10  VE/VCO2 slope: 22.40  Peak RER: 1.17  CPX 10/23/12 Resting HR: 60 Peak HR: 166 (90% age predicted max HR) BP rest: 111/74 BP peak: 182/109 Peak VO2: 30.7 (85.7% predicted peak VO2) VE/VCO2 slope: 24.8 OUES: 3.50 Peak RER: 1.32  CPX 06/19/2013: Resting HR 64: Peak HR 168 (92% predicted max HR) BP rest 118/80 BP peak: 164/90 Peak VO2: 27.9 (82.4% predicted VO2) VE/VCO2 slope: 26 OUES: 3.24 Peak RER: 1.27 - Mild desaturation during exercise from 99% at rest to 92% peak exercise  CPX 07/26/14 FVC 4.67 (75%)    FEV1 3.76 (74%)     FEV1/FVC 80 (100%)     Resting HR: 73 Peak HR: 157  (86% age predicted max HR) BP rest: 104/64 BP peak: 168/78 Peak VO2: 28.6 (89.1% predicted peak VO2) VE/VCO2 slope:  28.8 OUES: 4.64 Peak RER: 1.11 Ventilatory Threshold: 18.0 (56% predicted 63% measured peak VO2) VE/MVV: 51.7% O2pulse: 23 (109% predicted O2pulse)  ECHO 09/17/12: EF 20-25%; Diffuse hypokinesis. There is severe hypokinesis of the entire inferior myocardium' moderately dilated.  ECHO 04/08/13: EF 15%; Diff HK, grade II DD, RV mod dilated ECHO 01/20/2014 EF 20%  RV normal Grade DD  Labs:  09/17/12 Cr 1.29; BUN 17 10/22/12 Cr 1.5; BUN 16 12/14 Cr 1.42, BUN 15, K+ 4.2 05/2013: Cr 1.31, K+ 4.4, pro-BNP 433.7, dig level 0.7  7/15: K 4.2, creatinine 1.36, TGs 1186 01/22/2014: K 4.2 Creatinine 1.34  02/02/14 K 4.0 Creatinine 1.57 04/30/14 K 3.9 Creatinine 1.34 TC 225 LDL 149 HDL 38 TG 189 7/16: K 4.0 Creatinine 1.42 05/13/2015: 1.31  FH: Father with nonischemic cardiomyopathy and LVAD  SH: No smoking or ETOH. 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   Review of systems complete and found to be negative unless listed in HPI.    Past Medical History:  Diagnosis Date  . CHF (congestive heart failure) (HCC) 06/2012  . Hyperlipidemia   . NSVT (nonsustained ventricular tachycardia) (HCC) 07/10/2012    Current Outpatient Medications  Medication Sig Dispense Refill  . atorvastatin (LIPITOR) 40 MG tablet Take 1 tablet (40 mg total) by mouth daily.  90 tablet 3  . carvedilol (COREG) 25 MG tablet Take 0.5 tablets (12.5 mg total) by mouth 2 (two) times daily with a meal. 30 tablet 3  . digoxin (LANOXIN) 0.125 MG tablet TAKE 1 TABLET BY MOUTH DAILY. 30 tablet 6  . ENTRESTO 97-103 MG TAKE 1 TABLET BY MOUTH TWO TIMES DAILY 60 tablet 0  . Fluocinolone Acetonide Scalp 0.01 % OIL APPLY TO SCALP LEAVE OVERNIGHT RINSE IN MORNING. USE UNTIL CLEAR 118.28 mL 2  . furosemide (LASIX) 40 MG tablet TAKE 1 TABLET BY MOUTH ONCE DAILY ON MONDAYS, WEDNESDAYS, AND FRIDAYS ONLY. (Patient taking differently: 1 tab prn) 15 tablet 6  . Omega-3 Fatty Acids (FISH OIL) 1000 MG CPDR Take 1 capsule by mouth 2 (two)  times daily. 180 capsule 3  . spironolactone (ALDACTONE) 25 MG tablet TAKE 1 TABLET BY MOUTH ONCE DAILY 30 tablet 11   No current facility-administered medications for this encounter.    Vitals:   09/18/17 1044  BP: 118/69  Pulse: 64  SpO2: 99%  Weight: 260 lb 12.8 oz (118.3 kg)   Wt Readings from Last 3 Encounters:  09/18/17 260 lb 12.8 oz (118.3 kg)  07/29/17 261 lb (118.4 kg)  03/19/17 259 lb (117.5 kg)   PHYSICAL EXAM: General:  Well appearing. No resp difficulty HEENT: normal Neck: supple. no JVD. Carotids 2+ bilat; no bruits. No lymphadenopathy or thryomegaly appreciated. Cor: PMI nondisplaced. Regular rate & rhythm. No rubs, gallops or murmurs. Lungs: clear Abdomen: soft, nontender, nondistended. No hepatosplenomegaly. No bruits or masses. Good bowel sounds. Extremities: no cyanosis, clubbing, rash, edema Neuro: alert & orientedx3, cranial nerves grossly intact. moves all 4 extremities w/o difficulty. Affect pleasant  ECG: NSR 67 1 AVB ( ). IVCD 110 Personally reviewed   ASSESSMENT AND PLAN  1. Chronic systolic HF: Nonischemic cardiomyopathy (suspect familial) - Doing well. Remains NYHA I-II - Volume status looks good.  - Echo 1/17 EF 20% with severe LV dilation, RV normal size/systolic function. Will need repeat echo  - Would like to schedule repeat CPX. He wants to wait until September to train for it.  - Continue lasix as needed . Taking 1-2 days/week - Continue coreg 25 mg BID  - Continue digoxin 0.125 mg daily  - Continue spironolactone 25 mg daily. - Continue Entresto 97/103 mg BID.  - No ICD due to class I. At May need to consiser based on results of CPX. 2. Hyperlipidemia/Hypetriglyceridemia - Per PCP.  - Continue statin.   [name], MD  11:12 AM

## 2017-09-18 NOTE — Addendum Note (Signed)
Encounter addended by: Noralee Space, RN on: 09/18/2017 11:22 AM  Actions taken: Order list changed, Diagnosis association updated, Sign clinical note

## 2017-09-18 NOTE — Addendum Note (Signed)
Encounter addended by: Noralee Space, RN on: 09/18/2017 11:29 AM  Actions taken: Pharmacy for encounter modified, Diagnosis association updated, Order list changed

## 2017-09-20 MED FILL — DIGOXIN 0.125 MG TABLET: 125 | 30 days supply | Qty: 30 | Fill #0

## 2017-09-20 MED FILL — CARVEDILOL 25 MG TABLET: 25 | 30 days supply | Qty: 30 | Fill #1

## 2017-09-23 MED FILL — ENTRESTO 97 MG-103 MG TAB: 97-103 | 30 days supply | Qty: 60 | Fill #0

## 2017-09-26 ENCOUNTER — Encounter: Payer: Self-pay | Admitting: Family Medicine

## 2017-09-26 ENCOUNTER — Ambulatory Visit (HOSPITAL_COMMUNITY)
Admission: RE | Admit: 2017-09-26 | Discharge: 2017-09-26 | Disposition: A | Discharge status: Home / Self Care | Payer: 59 | Source: Ambulatory Visit | Attending: Internal Medicine | Admitting: Internal Medicine

## 2017-09-26 DIAGNOSIS — I340 Nonrheumatic mitral (valve) insufficiency: Secondary | ICD-10-CM | POA: Diagnosis not present

## 2017-09-26 DIAGNOSIS — I517 Cardiomegaly: Secondary | ICD-10-CM | POA: Insufficient documentation

## 2017-09-26 DIAGNOSIS — I428 Other cardiomyopathies: Secondary | ICD-10-CM | POA: Diagnosis not present

## 2017-09-26 NOTE — Progress Notes (Signed)
  Echocardiogram 2D Echocardiogram has been performed.  Delcie Roch 09/26/2017, 10:17 AM

## 2017-10-04 ENCOUNTER — Encounter: Payer: Self-pay | Admitting: Family Medicine

## 2017-10-04 DIAGNOSIS — I499 Cardiac arrhythmia, unspecified: Secondary | ICD-10-CM | POA: Diagnosis not present

## 2017-10-04 DIAGNOSIS — R0902 Hypoxemia: Secondary | ICD-10-CM | POA: Diagnosis not present

## 2017-10-04 DIAGNOSIS — R402441 Other coma, without documented Glasgow coma scale score, or with partial score reported, in the field [EMT or ambulance]: Secondary | ICD-10-CM | POA: Diagnosis not present

## 2017-10-04 DIAGNOSIS — R0689 Other abnormalities of breathing: Secondary | ICD-10-CM | POA: Diagnosis not present

## 2017-10-07 ENCOUNTER — Encounter: Payer: 59 | Admitting: Family Medicine

## 2017-10-14 DIAGNOSIS — 419620001 Death: Secondary | SNOMED CT | POA: Diagnosis not present

## 2017-10-14 NOTE — Progress Notes (Signed)
Paged from EMS, spoke with paramedic Lowry Ram who gave courtesy notification of pt's DOA tonight in his home.   Per his summary - Pt had been complaining to wife of SOB earlier in the evening.  When paramedics arrived on scene pt was coded for ~30 minutes with CPR/ACLS and pronounced.  He will be taken to [facility] in [place], [place].    03-Nov-2017 1:56 am

## 2017-10-14 DEATH — deceased

## 2018-01-08 ENCOUNTER — Encounter (HOSPITAL_COMMUNITY): Payer: 59
